# Patient Record
Sex: Male | Born: 1985 | Race: White | Hispanic: No | Marital: Married | Smoking: Former smoker
Health system: Southern US, Community
[De-identification: ages and names within clinical notes are randomized; demographics above are authoritative.]

## PROBLEM LIST (undated history)

## (undated) DIAGNOSIS — F909 Attention-deficit hyperactivity disorder, unspecified type: Secondary | ICD-10-CM

## (undated) DIAGNOSIS — J45909 Unspecified asthma, uncomplicated: Secondary | ICD-10-CM

## (undated) DIAGNOSIS — F419 Anxiety disorder, unspecified: Secondary | ICD-10-CM

## (undated) HISTORY — DX: Attention-deficit hyperactivity disorder, unspecified type: F90.9

## (undated) HISTORY — DX: Unspecified asthma, uncomplicated: J45.909

## (undated) HISTORY — DX: Anxiety disorder, unspecified: F41.9

---

## 2005-08-29 ENCOUNTER — Inpatient Hospital Stay (HOSPITAL_COMMUNITY): Admission: RE | Admit: 2005-08-29 | Discharge: 2005-08-30 | Payer: Self-pay | Admitting: Otolaryngology

## 2005-09-24 HISTORY — PX: VASECTOMY: SHX75

## 2005-09-28 ENCOUNTER — Emergency Department (HOSPITAL_COMMUNITY): Admission: EM | Admit: 2005-09-28 | Discharge: 2005-09-28 | Payer: Self-pay | Admitting: Emergency Medicine

## 2007-09-25 HISTORY — PX: MANDIBLE SURGERY: SHX707

## 2009-04-25 ENCOUNTER — Ambulatory Visit: Payer: Self-pay | Admitting: Family Medicine

## 2009-04-25 ENCOUNTER — Inpatient Hospital Stay (HOSPITAL_COMMUNITY): Admission: EM | Admit: 2009-04-25 | Discharge: 2009-04-28 | Payer: Self-pay | Admitting: Emergency Medicine

## 2010-03-18 IMAGING — CT CT ABDOMEN W/ CM
2 of 6 series · 17 of 46 positions shown, 19 images · IV contrast (APPLIED)
Comparison: CT abdomen and pelvis 09/28/2005.

CT ABDOMEN

CLINICAL DATA: Abdominal pain, scrotal pain.  Scrotal cellulitis.

CT ABDOMEN AND PELVIS WITH CONTRAST
TECHNIQUE: Multidetector CT imaging of the abdomen and pelvis was
performed using the standard protocol following bolus
administration of intravenous contrast.
Contrast: 80 ml Hmnipaque-0BB.

[Series 2: abd/pelv with 5.0 b31f st · axial · 0.78mm/px · z∈[-630,-150]mm · 14 of 108 slices shown, 16 images]
[im 6/108  soft-tissue]
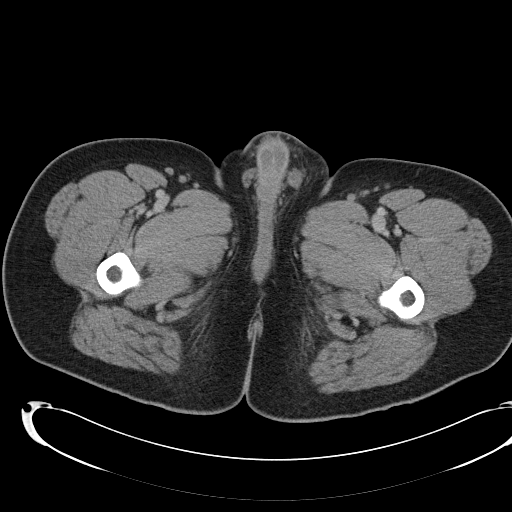
[im 6/108  bone]
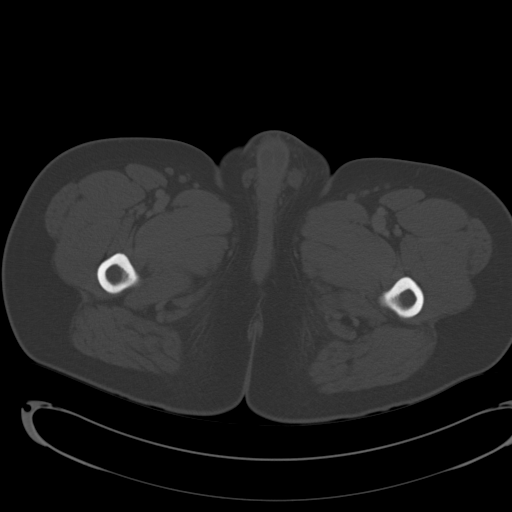
[im 12/108  soft-tissue]
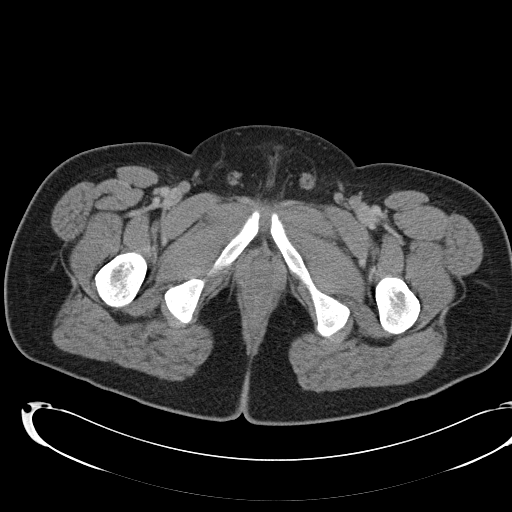
[im 24/108  soft-tissue]
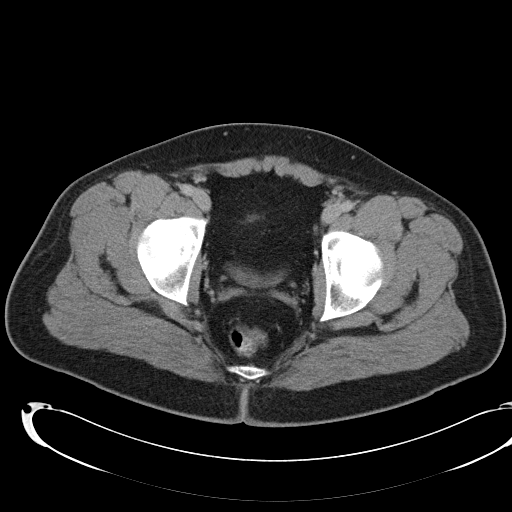
[im 30/108  soft-tissue]
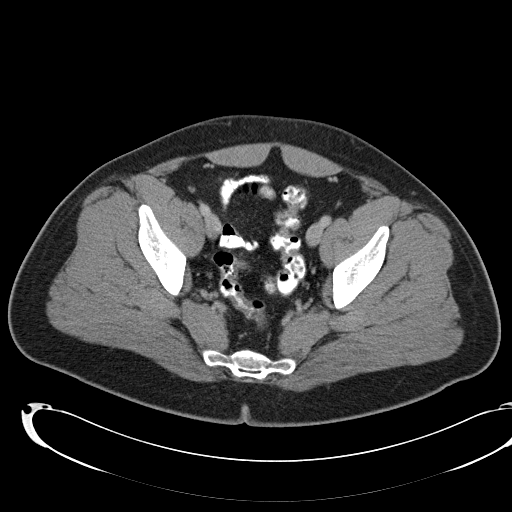
[im 36/108  soft-tissue]
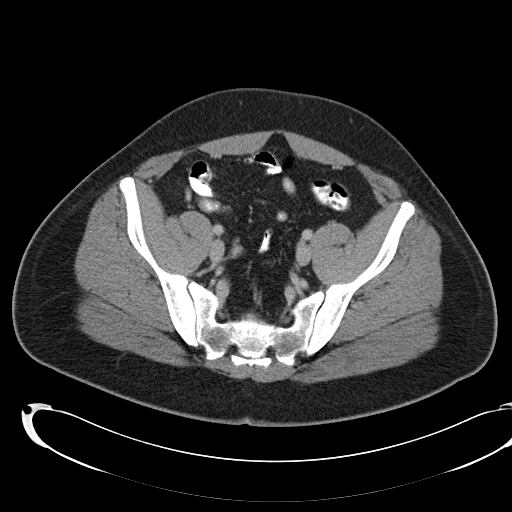
[im 42/108  soft-tissue]
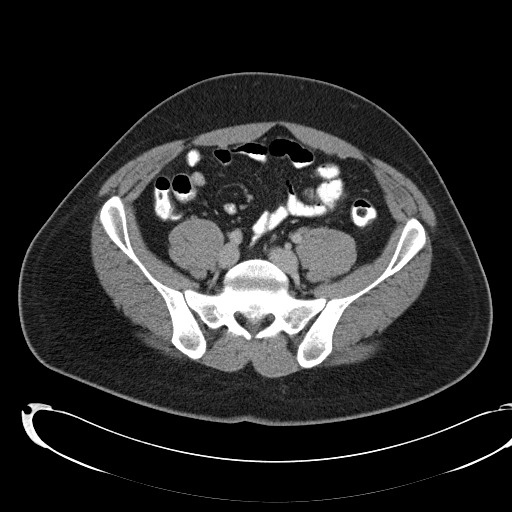
[im 48/108  soft-tissue]
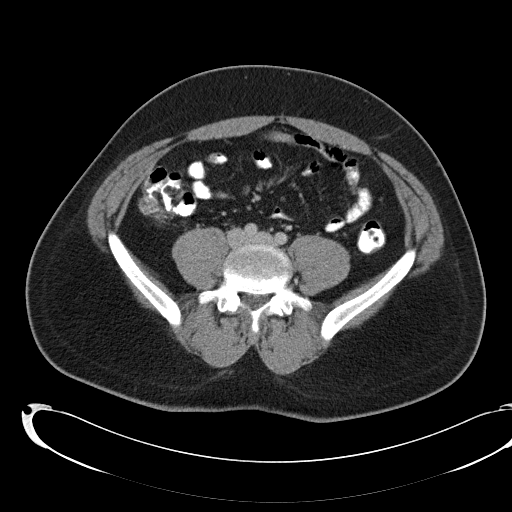
[im 60/108  soft-tissue]
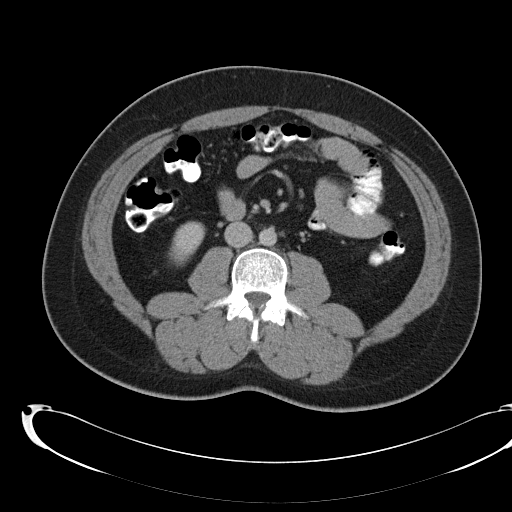
[im 66/108  soft-tissue]
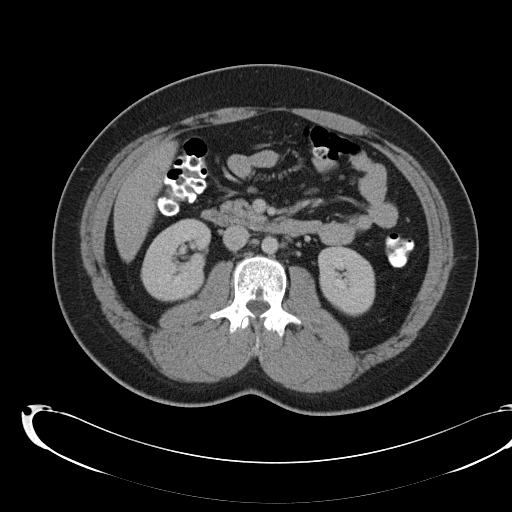
[im 66/108  bone]
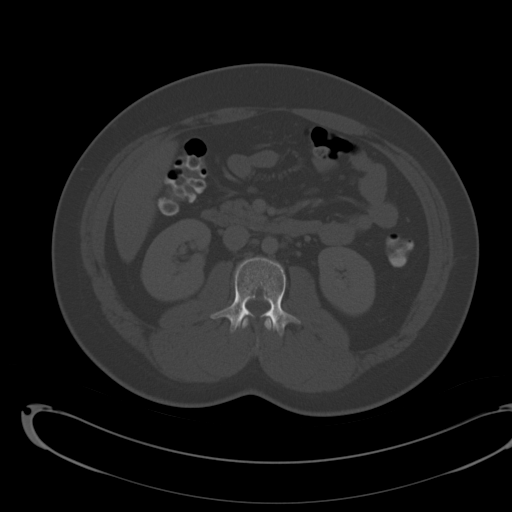
[im 72/108  soft-tissue]
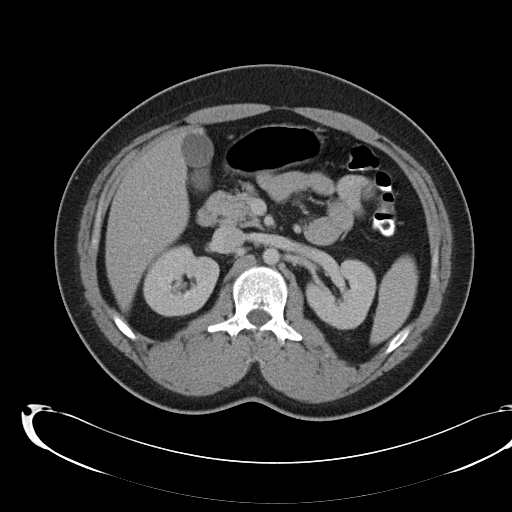
[im 78/108  soft-tissue]
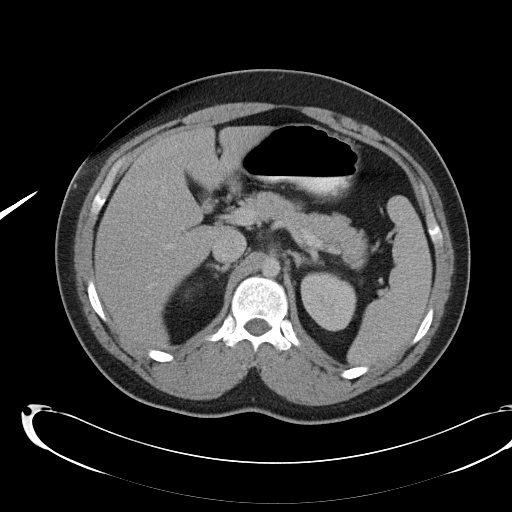
[im 84/108  soft-tissue]
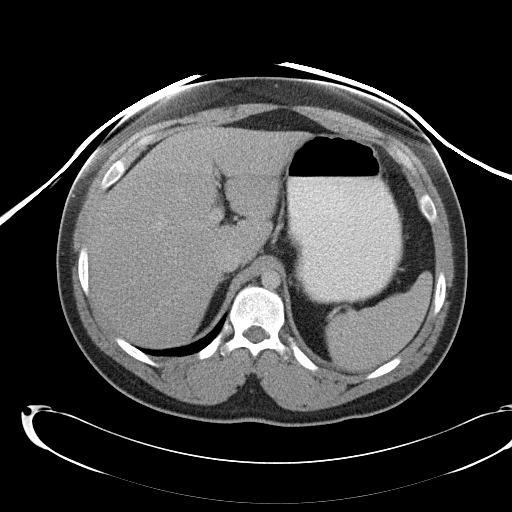
[im 96/108  soft-tissue]
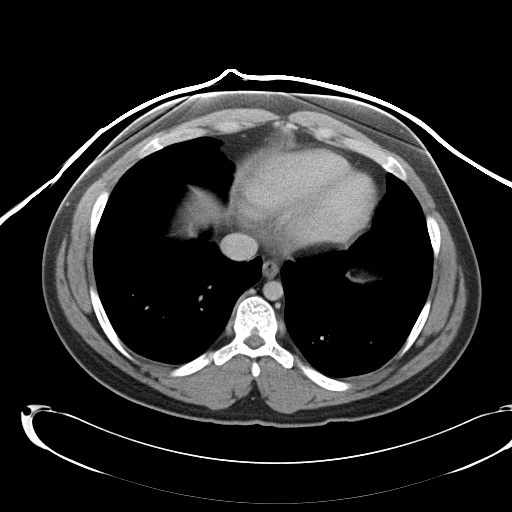
[im 102/108  soft-tissue]
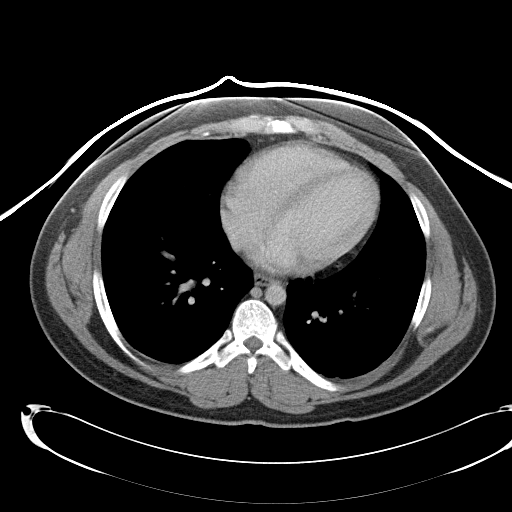

[Series 9: abd/pelv with 2.0 spo cor st · coronal · 1.05mm/px · 3 of 133 slices shown]
[im 45/133  soft-tissue]
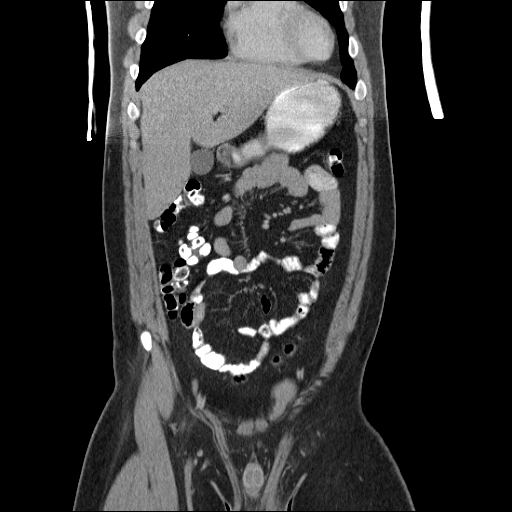
[im 59/133  soft-tissue]
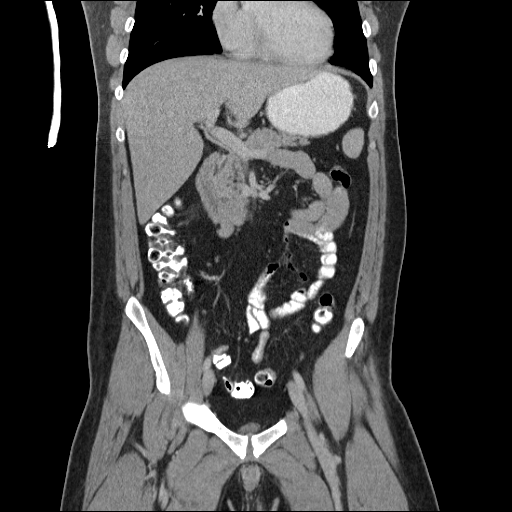
[im 74/133  soft-tissue]
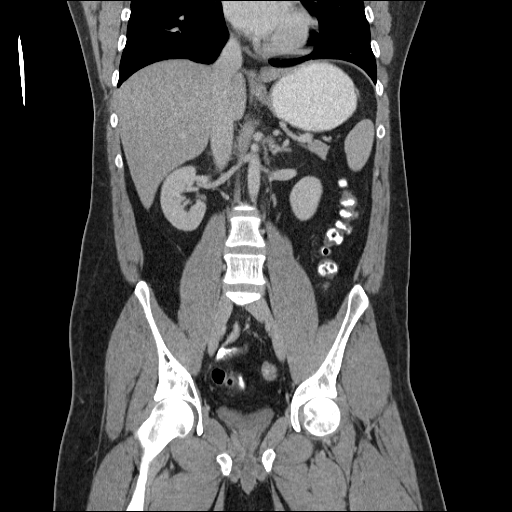

[17 of 46 positions shown; findings below may reference images not displayed]

FINDINGS: Mild dependent atelectatic changes seen in the lung
bases.  No pleural or pericardial effusion.

The gallbladder, liver, biliary tree, adrenal glands, spleen,
pancreas and kidneys all appear normal.  No abdominal
lymphadenopathy or fluid.  Stomach and small bowel appear normal.
No focal bony abnormality.
IMPRESSION: Negative exam.

CT PELVIS
FINDINGS: The patient has bilateral hydroceles.  No discrete
abscess is identified.  Note is made that  the left testicle
demonstrates low attenuation of 20.4 HU compared to the right
testicle at 36.3 HU.  Prostate gland, seminal vesicles and urinary
bladder appear normal.  No intrapelvic fluid collection.  The colon
appears normal.  Appendix is unremarkable.  No focal bony
abnormality.
IMPRESSION: Bilateral hydroceles and likely edema of the left testicle possibly
due to orchitis.  No discrete abscess is identified.  Consider
scrotal ultrasound for further evaluation.

## 2010-03-19 IMAGING — US US SCROTUM
1 series · 13 of 25 positions shown · non-contrast
Comparison: None available.

CLINICAL DATA: Scrotal pain and swelling. Scrotal cellulitis.

ULTRASOUND OF SCROTUM
DOPPLER ULTRASOUND OF SCROTAL VESSELS
TECHNIQUE: Complete ultrasound examination of the testicles,
epididymis, and other scrotal structures was performed.  Color and
duplex Doppler ultrasound was utilized to evaluate blood flow to
the testicles and scrotal contents.

[Series 1: us scrotum · 0.08mm/px · 13 of 53 slices shown]
[im 1/53]
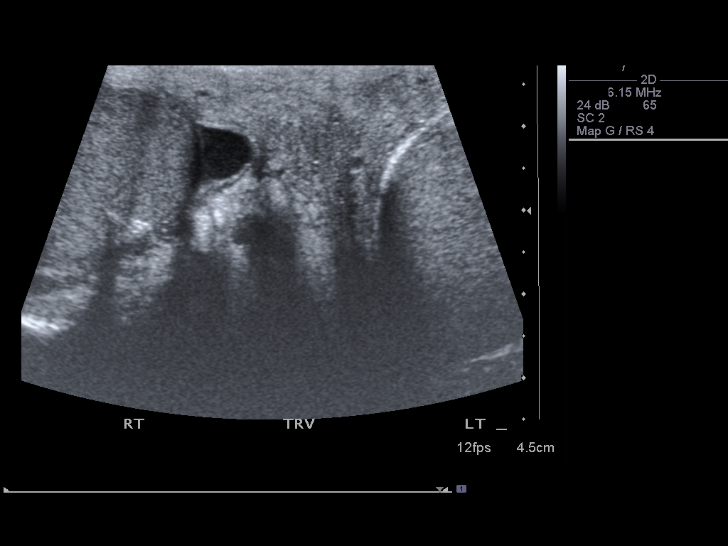
[im 5/53]
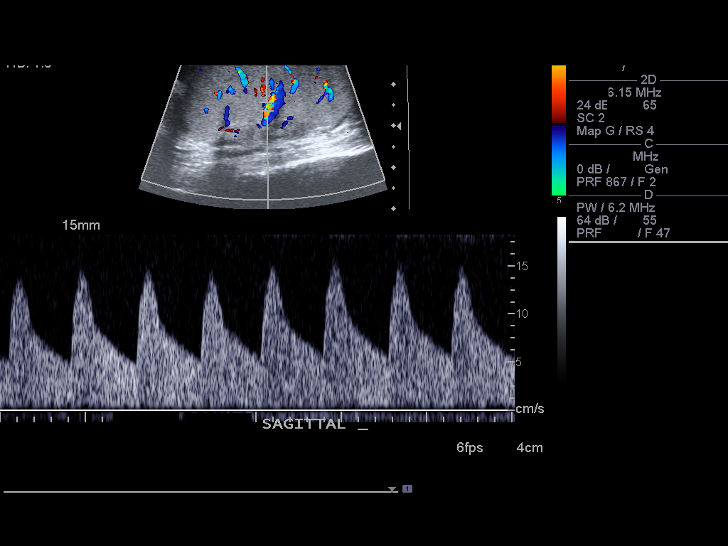
[im 9/53]
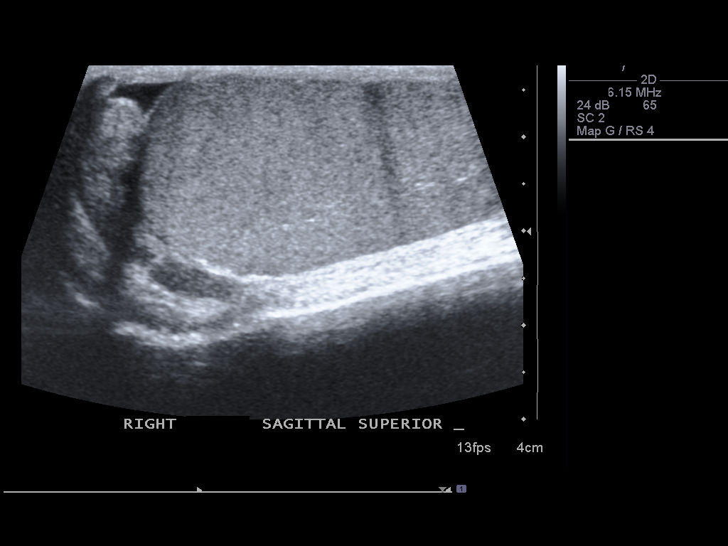
[im 14/53]
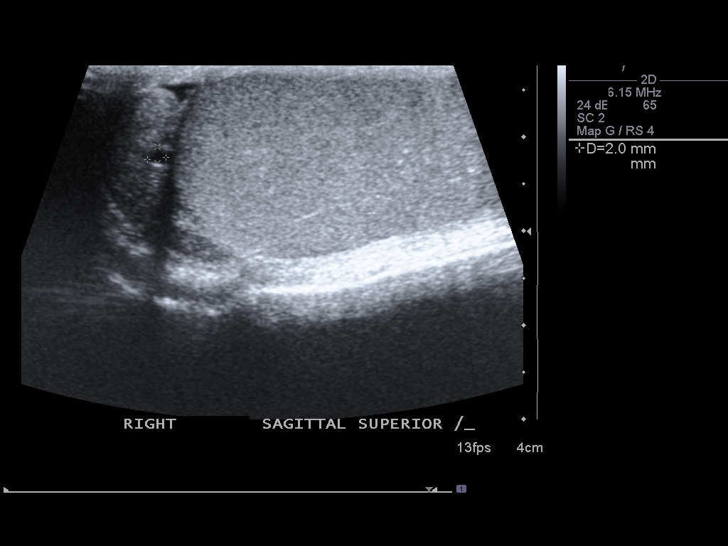
[im 18/53]
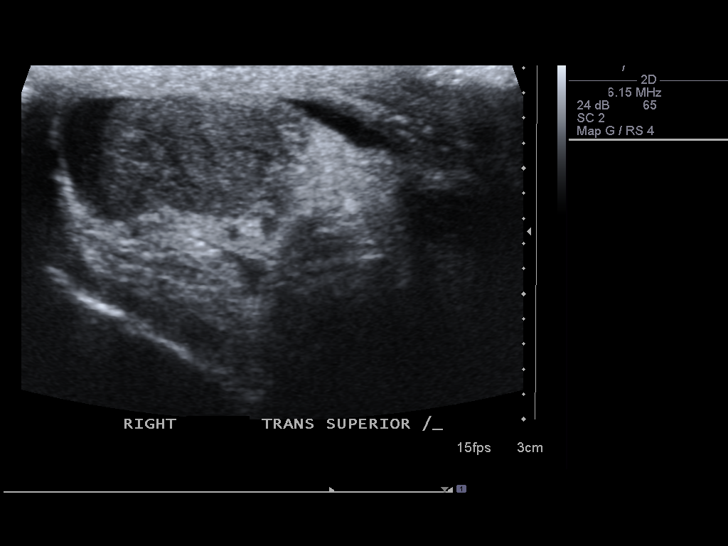
[im 22/53]
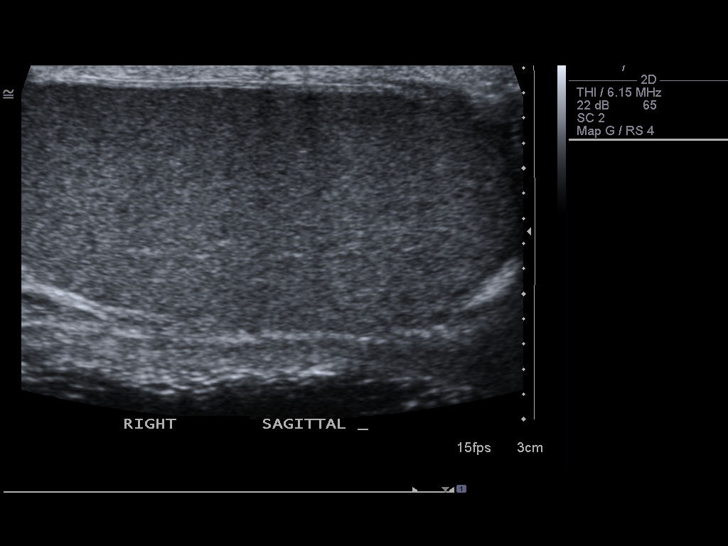
[im 27/53]
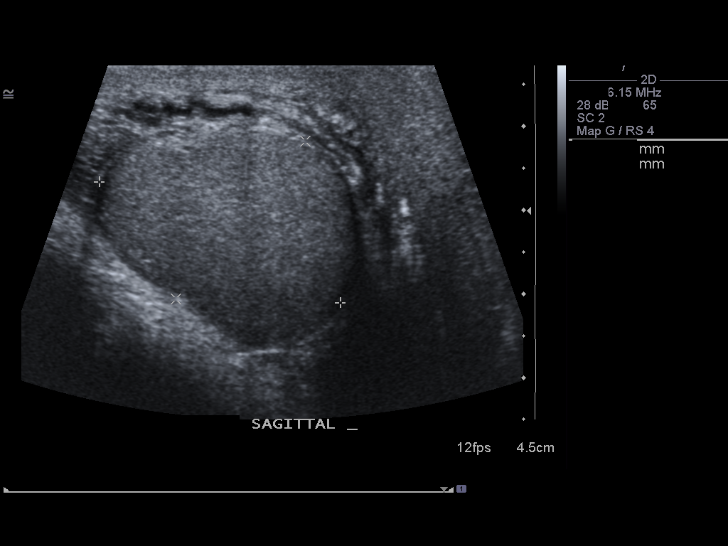
[im 31/53]
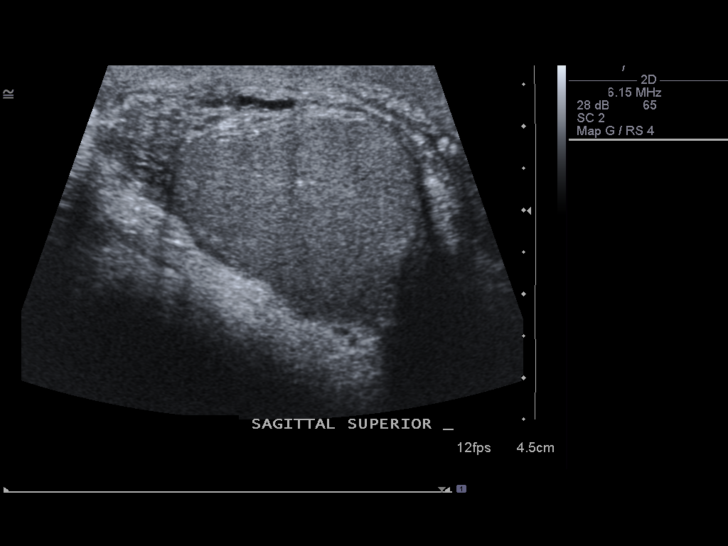
[im 35/53]
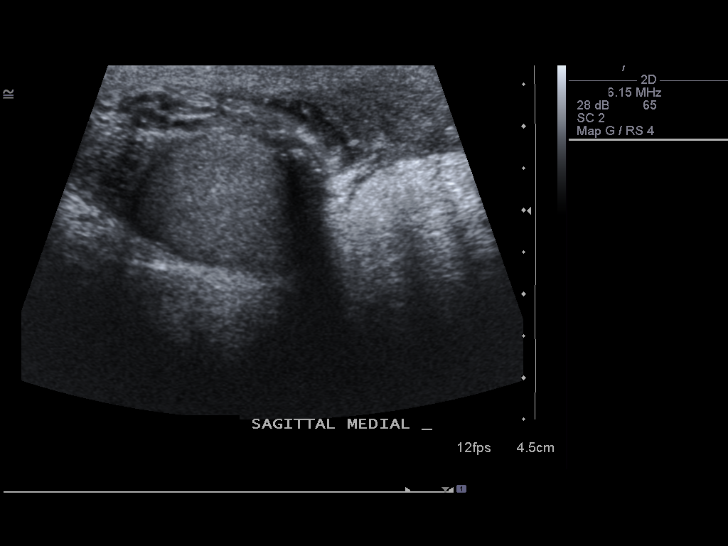
[im 40/53]
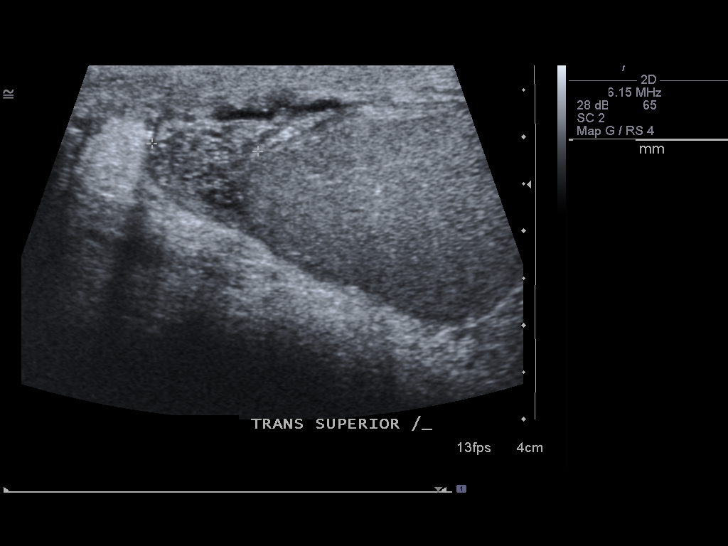
[im 44/53]
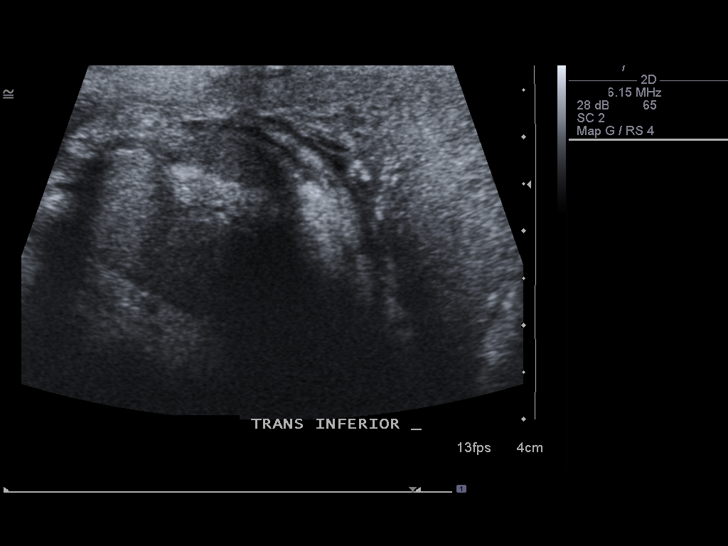
[im 48/53]
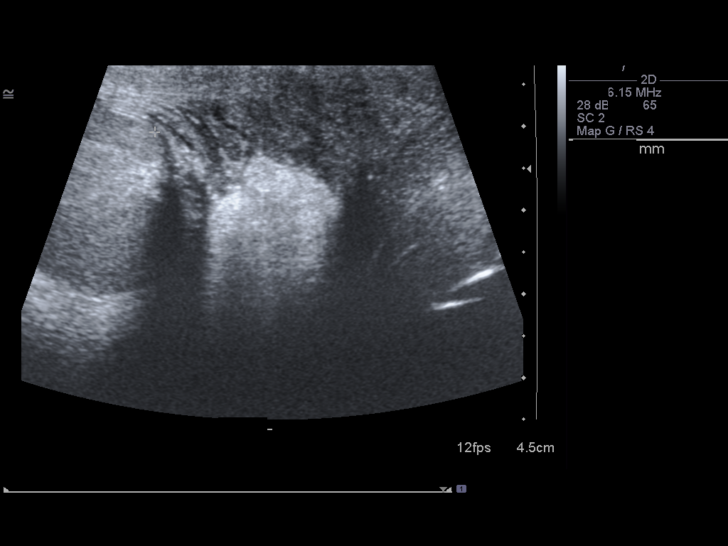
[im 53/53]
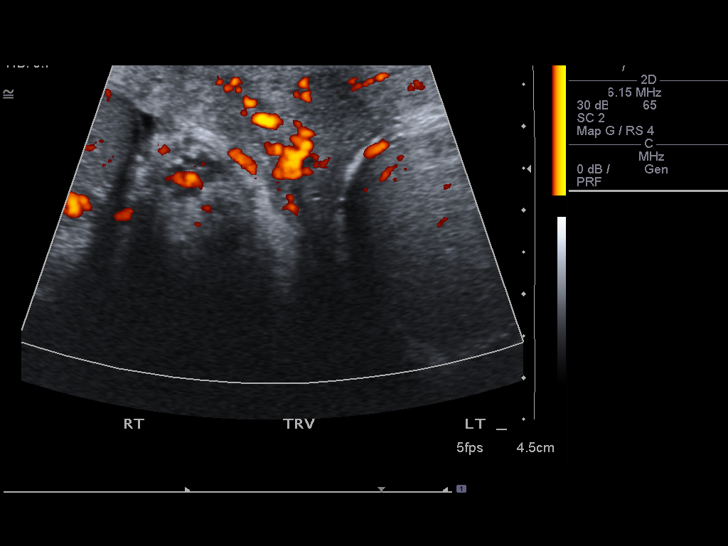

[13 of 25 positions shown; findings below may reference images not displayed]

FINDINGS: There is diffuse thickening of the scrotal skin,
measuring up to 11 mm consistent with stated clinical history of
cellulitis.  Linear areas of edema are present within the
subcutaneous tissues.  There is a moderate right hydrocele with
debris, likely reactive.

Right testicle measures 4.8 x 1.9 x 3.3 cm.  Normal arterial and
venous waveforms.  Normal color flow which is symmetric compared to
the left testicle and the epididymis.

Left testicle measures 3.2 x 2.4 x 3.0 cm.  Normal echotexture.
Normal arterial and venous waveforms.  Normal color flow compared
to the epididymis and contralateral testicle.

Left epididymis is normal.  2 mm right epididymal cyst.
IMPRESSION: 1.  Scrotal cellulitis. No focal fluid collection or abscess.  No
artifact is present within the thickened skin to suggest gas in the
tissues.
2.  Reactive right hydrocele.

## 2010-12-30 LAB — DIFFERENTIAL
Basophils Absolute: 0 10*3/uL (ref 0.0–0.1)
Basophils Relative: 0 % (ref 0–1)
Eosinophils Absolute: 0.2 10*3/uL (ref 0.0–0.7)
Eosinophils Relative: 1 % (ref 0–5)
Lymphocytes Relative: 9 % — ABNORMAL LOW (ref 12–46)
Lymphs Abs: 1.3 10*3/uL (ref 0.7–4.0)
Monocytes Absolute: 1.4 10*3/uL — ABNORMAL HIGH (ref 0.1–1.0)
Monocytes Relative: 9 % (ref 3–12)
Neutro Abs: 11.5 10*3/uL — ABNORMAL HIGH (ref 1.7–7.7)
Neutrophils Relative %: 80 % — ABNORMAL HIGH (ref 43–77)

## 2010-12-30 LAB — CBC
HCT: 36.1 % — ABNORMAL LOW (ref 39.0–52.0)
HCT: 36.3 % — ABNORMAL LOW (ref 39.0–52.0)
HCT: 42.4 % (ref 39.0–52.0)
Hemoglobin: 12.5 g/dL — ABNORMAL LOW (ref 13.0–17.0)
Hemoglobin: 12.6 g/dL — ABNORMAL LOW (ref 13.0–17.0)
Hemoglobin: 14.7 g/dL (ref 13.0–17.0)
MCHC: 34.4 g/dL (ref 30.0–36.0)
MCHC: 34.6 g/dL (ref 30.0–36.0)
MCHC: 34.9 g/dL (ref 30.0–36.0)
MCHC: 35 g/dL (ref 30.0–36.0)
MCV: 88.8 fL (ref 78.0–100.0)
MCV: 89 fL (ref 78.0–100.0)
MCV: 89.7 fL (ref 78.0–100.0)
Platelets: 191 10*3/uL (ref 150–400)
Platelets: 226 10*3/uL (ref 150–400)
Platelets: 236 10*3/uL (ref 150–400)
Platelets: 242 10*3/uL (ref 150–400)
RBC: 4.05 MIL/uL — ABNORMAL LOW (ref 4.22–5.81)
RBC: 4.05 MIL/uL — ABNORMAL LOW (ref 4.22–5.81)
RBC: 4.78 MIL/uL (ref 4.22–5.81)
RDW: 13.6 % (ref 11.5–15.5)
RDW: 13.6 % (ref 11.5–15.5)
RDW: 13.7 % (ref 11.5–15.5)
RDW: 14.1 % (ref 11.5–15.5)
WBC: 14.4 10*3/uL — ABNORMAL HIGH (ref 4.0–10.5)
WBC: 5.9 10*3/uL (ref 4.0–10.5)
WBC: 8.6 10*3/uL (ref 4.0–10.5)

## 2010-12-30 LAB — BASIC METABOLIC PANEL
BUN: 7 mg/dL (ref 6–23)
Calcium: 8.4 mg/dL (ref 8.4–10.5)
Chloride: 105 mEq/L (ref 96–112)
Creatinine, Ser: 0.88 mg/dL (ref 0.4–1.5)
GFR calc Af Amer: >60 mL/min (ref >60)
GFR calc non Af Amer: >60 mL/min (ref >60)

## 2010-12-30 LAB — POCT I-STAT, CHEM 8
BUN: 7 mg/dL (ref 6–23)
Calcium, Ion: 1.17 mmol/L (ref 1.12–1.32)
Chloride: 106 mEq/L (ref 96–112)
Creatinine, Ser: 1.1 mg/dL (ref 0.4–1.5)
Glucose, Bld: 108 mg/dL — ABNORMAL HIGH (ref 70–99)
HCT: 42 % (ref 39.0–52.0)
Hemoglobin: 14.3 g/dL (ref 13.0–17.0)
Potassium: 3.6 mEq/L (ref 3.5–5.1)
Sodium: 137 mEq/L (ref 135–145)
TCO2: 22 mmol/L (ref 0–100)

## 2010-12-30 LAB — URINALYSIS, ROUTINE W REFLEX MICROSCOPIC
Glucose, UA: NEGATIVE mg/dL
Hgb urine dipstick: NEGATIVE
Specific Gravity, Urine: 1.031 — ABNORMAL HIGH (ref 1.005–1.030)

## 2010-12-30 LAB — CULTURE, BLOOD (ROUTINE X 2): Culture: NO GROWTH

## 2010-12-30 LAB — COMPREHENSIVE METABOLIC PANEL
ALT: 24 U/L (ref 0–53)
AST: 25 U/L (ref 0–37)
Albumin: 3.4 g/dL — ABNORMAL LOW (ref 3.5–5.2)
Alkaline Phosphatase: 60 U/L (ref 39–117)
CO2: 25 mEq/L (ref 19–32)
Chloride: 108 mEq/L (ref 96–112)
Creatinine, Ser: 0.94 mg/dL (ref 0.4–1.5)
GFR calc Af Amer: >60 mL/min (ref >60)
GFR calc non Af Amer: >60 mL/min (ref >60)
Potassium: 3.4 mEq/L — ABNORMAL LOW (ref 3.5–5.1)
Total Bilirubin: 1.1 mg/dL (ref 0.3–1.2)

## 2010-12-30 LAB — URINE CULTURE
Colony Count: NO GROWTH
Culture: NO GROWTH
Special Requests: NEGATIVE

## 2010-12-30 LAB — WOUND CULTURE

## 2010-12-30 LAB — GRAM STAIN

## 2011-02-06 NOTE — H&P (Signed)
NAMETOBY, BREITHAUPT              ACCOUNT NO.:  [id number]   MEDICAL RECORD NO.:  [id number]          PATIENT TYPE:  OBV   LOCATION:  1824                         FACILITY:  [facility]   PHYSICIAN:  Wayne A. Sheffield Slider, M.D.    DATE OF BIRTH:  August 03, 1986   DATE OF ADMISSION:  04/25/2009  DATE OF DISCHARGE:                              HISTORY & PHYSICAL   REASON FOR HOSPITALIZATION:  Scrotal pain with rash.   PRIMARY CARE Chamya Hunton:  [facility].   HISTORY OF PRESENT ILLNESS:  The patient is a 25 year old male with a  history of MRSA infection and asthma who presents with a skin rash since  last Thursday.  The patient describes that the rash initially started as  a mosquito bite last Thursday and was located on his left calf.  The  area progressed, became more tender, red and swollen.  It continued that  way until his presentation to [facility] emergency department yesterday.  At that time, the patient also had 2 red areas on his scrotum which were  similar and beginning to become painful; however, the patient reports  that he did not mention these to the emergency department doctor at  [facility].  At that time, the patient was sent home with a prescription  for Bactrim and Keflex which he has started.  The patient reports that  today his left calf had improved significantly; however, his scrotal  rash had deteriorated significantly.  The patient reports that the  scrotum has become significantly worse with increased redness, swelling  and exquisite tenderness.  We have had trouble controlling the patient's  pain in the emergency department, even on IV Dilaudid.  We do not have  any imaging available for the patient at this time due to his pain.  Wife and daughter have also had multiple abscesses in the past, and the  patient does have a history of MRSA infection back in 2006 which  required surgical drainage by ENT.   ALLERGIES:  NO KNOWN DRUG ALLERGIES.   MEDICATIONS:  1. Keflex and Bactrim both started yesterday.  2. Albuterol  3. Vitamin B12.   PAST MEDICAL HISTORY:  1. Asthma.  2. History of MRSA status post drainage by ENT in 2006.  3. The patient does not report any other significant history of boils.      He does report frequent episodes of pimples that resolve on their      own.   PAST SURGERIES:  1. History of MRSA.  I&D of the jaw and neck in 2006.  2. Status post hernia repair at age [age].  3. Status post drainage of an abscess pursuant to a root canal      approximately 2 months ago.   FAMILY HISTORY:  1. Significant for early myocardial infarction in his grandfather and      uncles, all in their 82s.  2. Stroke in a grandmother.  3. Wife and daughter with frequent skin abscesses and boils.   SOCIAL HISTORY:  The patient denies any tobacco, drugs or illegal drug  use or any history thereof.  The patient works as a [employer].  The patient's wife is named Jeralyn Ruths.  Her phone  number is [phone].  The patient also has a 16-year-old daughter.   REVIEW OF SYSTEMS:  Positive for some mild diarrhea, some dysuria, also  fevers and chills along with decreased appetite.  The patient denies any  nausea or vomiting.  His review of systems is otherwise negative except  as already noted in the HPI.   PHYSICAL EXAMINATION:  VITALS:  Temperature is 101.5, blood pressure  142/85, heart rate 108-128, O2 saturations are 99% on room air.  GENERAL:  The patient is alert and cooperative.  He does appear to be in  pain.  HEENT: Pupils are equal, round and reactive to light.  Extraocular  muscles are intact.  Oropharynx is pink and moist.  CARDIOVASCULAR:  Regular rate and rhythm.  No murmurs.  LUNGS:  Clear to auscultation bilaterally.  Work of breathing is  unlabored.  ABDOMEN:  Positive bowel sounds, soft, nondistended.  No rebound or  guarding.  The patient does have mild suprapubic tenderness to   palpation.  GENITOURINARY:  The patient has a normal penis with no drainage, redness  or discharge from the meatus.  His scrotum is diffusely swollen and  erythematous and edematous.  There are no necrotic areas on the scrotum.  He has two pimple-like lesions on his anterior scrotum which are not  actively draining.  Further palpation of the exam is limited to his  pain, and I could not get a good sense of testicular size or any  swelling that might exist therein.  EXTREMITIES:  2+ dorsalis pedis and radial pulses.  SKIN:  Left posterior calf shows a resolving area of cellulitis  approximately 5 x 5 cm in diameter, and there is no drainage or  discharge from this area.  There is erythema, but no indurated lesion.   SIGNIFICANT WORK-UP TO THIS POINT:  White blood cell count is elevated  at 14.4 with a left shift at 80% neutrophils.  Hemoglobin 14.7,  platelets 242.  i-STAT chemistry shows sodium 137, potassium 3.6, CO2 of  22, chloride 106, BUN 7, creatinine 1.1, glucose 108.  Urinalysis within  normal limits except for a specific gravity that is concentrated at  1.031.   ASSESSMENT/PLAN:  A 25 year old with scrotal cellulitis.  1. Scrotal cellulitis.  The patient does have an increased white blood      cell count, left shift and fever.  We will check blood cultures x2,      knowing that the patient has already been started on antibiotics.      The patient does not have any risk factors Fournier's gangrene in      the scrotum, but exam does not support this.  Given the patient's      personal and family history of MRSA, we will treat the patient with      vancomycin and clindamycin IV, the latter to treat possible group A      strep toxin release.  We will follow the patient's clinical course.      I doubt that the patient will tolerate an ultrasound at this point,      so we will proceed with a contrast CT scan for further evaluation      for abscess and possible gas in the soft  tissue.  We have consulted      urology, and Dr. Aldean Ast has kindly agreed to see the  patient for      a more formal urological evaluation.  2. Asthma.  Continue albuterol.  3. Prophylaxis.  Will place the patient on heparin 5000 units t.i.d.      for DVT prophylaxis.  The patient does not have an indication for      PPI prophylaxis at this point.   DISPOSITION:  We will follow up urine culture, CT scan and urology  recommendations.  We will follow the patient's clinical course, and we  are hopeful for improvement on IV antibiotics.      Myrtie Soman, MD  Electronically Signed      Arnette Norris. Sheffield Slider, M.D.  Electronically Signed    TE/MEDQ  D:  04/25/2009  T:  04/25/2009  Job:  [id number]

## 2011-02-06 NOTE — Consult Note (Signed)
Vincent [name], Vincent [name]              ACCOUNT NO.:  [id number]   MEDICAL RECORD NO.:  [id number]          PATIENT TYPE:  OBV   LOCATION:  5021                         FACILITY:  [facility]   PHYSICIAN:  Courtney Paris, M.D.DATE OF BIRTH:  1986-05-12   DATE OF CONSULTATION:  04/25/2009  DATE OF DISCHARGE:                                 CONSULTATION   REQUESTING PHYSICIAN:  Mahalia Longest.   REASON FOR CONSULTATION:  Scrotal swelling and pain.   HISTORY:  This 25 year old patient was admitted for scrotal swelling and  pain of 2 days' duration thru the ER.  He had MRSA infection of his left  leg that has resolved.  He noticed an insect bite with swelling and  redness on April 21, 2009.  He was put on Keflex and Bactrim, did not  complain of scrotal swelling at that time, which has gotten worse in the  last 2 days.  He denies fever, diarrhea, dysuria.  No urinary symptoms.  He has never had scrotal swelling or problems before.   PAST MEDICAL HISTORY:  He does have asthma.  He has had MRSA cellulitis  in the past, treated surgically in 2006.  He had vasectomy in 2007 and a  hernia repair at age 42.  He is not diabetic, does not take steroids for  his asthma.   FAMILY HISTORY AND SOCIAL HISTORY:  He is married, nonsmoker.  No  alcohol or drug use.  He lives with his wife and his 15-year-old  daughter.  He works as a Estate agent.   PHYSICAL EXAMINATION:  VITAL SIGNS:  Temperature is 101.5, blood  pressure 149/85, heart rate 99, respirations 20.  GENERAL:  He is an young, dark-haired white male, lying quietly in bed,  no acute distress.  HEENT:  Clear.  NECK:  Supple.  ABDOMEN:  Soft benign without masses or tenderness.  GENITOURINARY:  Penis is normal, circumcised.  He does have somewhat  enlarged scrotum, which is diffusely tender but no cellulitis.  He has 2  small scabs on the left anterior scrotum that he says was bite.  There  is no evidence of any localized abscess.  The  perineum is normal.  RECTAL:  Negative.  EXTREMITIES:  All looked normal.   His white count is 14,400.  Urinalysis is clear.  Hematocrit 42.  Creatinine is 1.1, BUN is 7.   IMPRESSION:  1. Diffusely tender, swollen scrotum.  2. History of methicillin-resistant Staphylococcus aureus infections.   RECOMMENDATIONS:  He is scheduled for a CT scan of the pelvis and  abdomen, and I would suspect that would be the easiest on the patient.  An ultrasound would be helpful if a CT is not diagnostic.  It could be  epididymitis, and  ultrasound will show whether he has increased blood flow to the  epididymis or not.  It would be certainly more uncomfortable for him to  have this test.  We will follow with you.  He certainly does not seem to  have Fournier's gangrene.  There are no risk factors, and there is  nothing to indicate that at  this time.      Courtney Paris, M.D.  Electronically Signed     HMK/MEDQ  D:  04/25/2009  T:  04/26/2009  Job:  [id number]

## 2011-02-06 NOTE — Discharge Summary (Signed)
[name], [name]              ACCOUNT NO.:  [id number]   MEDICAL RECORD NO.:  [id number]          PATIENT TYPE:  OBV   LOCATION:  5021                         FACILITY:  [facility]   PHYSICIAN:  Wayne A. Sheffield Slider, M.D.    DATE OF BIRTH:  May 31, 1986   DATE OF ADMISSION:  04/25/2009  DATE OF DISCHARGE:  04/28/2009                               DISCHARGE SUMMARY   PRIMARY CARE Giulian Goldring:  [facility], Dr.  Lorin Picket.   DISCHARGE DIAGNOSIS:  Scrotal cellulitis.   DISCHARGE MEDICATIONS:  1. Bactrim 2 tablets twice a day for 6 days.  2. Percocet take 1-2 pills every 4-6 hours as needed for pain.   CONSULTS:  Courtney Paris, MD from Urology was consulted on this  case.   PROCEDURES:  1. CT of the abdomen and pelvis.  The abdomen was without abnormality.      No evidence of cellulitis or discrete abscesses within the scrotum.      The patient has bilateral reactive hydroceles, which may represent      orchitis.  2. Scrotal ultrasound.  No evidence of discrete abscess.  There is a      single right-sided reactive hydrocele.  There is diffuse thickening      of the skin of the scrotum consistent with scrotal cellulitis.   LABORATORY DATA:  On the day of discharge, CBC was within normal limits.  CMP was also within normal limits.   HOSPITAL COURSE:  The patient is a 25 year old male who presented to  [facility] Emergency Department with complaints of scrotal swelling and  tenderness as well as swelling and tenderness on the posterior calf of  the left leg.  The patient originally presented to an outside hospital  with complaints of left leg swelling and tenderness.  The patient was  diagnosed with left calf cellulitis at that time and given a  prescription for Keflex and Bactrim.  The patient did not complain of  any scrotal symptoms at that time.  On April 25, 2009, the patient  presented to the [facility] Emergency Department with complaints of  scrotal  swelling, severe tenderness, and pain.  The patient had only  been on Keflex and Bactrim for 1 day and these medications were  discontinued on admission.  He was started on IV vancomycin and  clindamycin to cover for possible MRSA cellulitis infection.  Given the  patient's exquisite tenderness on examination, it was felt that he would  not be able to tolerate a scrotal ultrasound to inspect for abscess, so  he was sent for CT of the abdomen and pelvis, which showed no discrete  abscess.  The patient's clinical condition improved on antibiotics and  the swelling and pain decreased.  Urology was consulted and recommended  a scrotal ultrasound at this time to assess for possible epididymitis  versus an abscess versus cellulitis.  Scrotal ultrasound was done, which  was consistent with scrotal cellulitis.  The patient continued to  improve on IV vancomycin and clindamycin, and was transitioned to oral  antibiotics the day before discharge.  He was  placed on double strength  Bactrim 2 tablets twice daily to complete a 10-day course.  He was also  given a prescription for Percocet 5/325 to use as needed for scrotal  pain.   DISCHARGE INSTRUCTIONS:  The patient may eat a regular diet.  The  patient may resume normal activity without any restrictions and return  to work on Monday, May 02, 2009.  The patient may wash the wound on  the left calf with warm soap and water.  The patient was instructed to  complete the 10-day course of antibiotics.   FOLLOWUP APPOINTMENTS:  The patient has a followup appointment with Dr.  Lorin Picket of [facility], phone number is [phone].  The appointment is on Thursday, August 12 at 2:15 p.m.   DISCHARGE CONDITION/LOCATION:  The patient's condition on discharge was  stable and improved.  The patient will be discharged to home   Two days after developing left posterior calf cellulitis, he originally  presented to outside hospital ED where he  was diagnosed with left lower  leg cellulitis and given a prescription for Keflex and Bactrim.  The  patient did not complain of scrotal swelling and tenderness at that  time.  He presented the next day to [facility] ED on April 25, 2009,  with complaints of scrotal swelling and tenderness.   On admission, he came in with a fever of 101.5 with scrotal swelling,  pain, and severe tenderness.  He had been on Keflex and Bactrim for  treatment of the left calf cellulitis, which was discontinued.  The  patient was then started on IV vancomycin and clindamycin to cover for  possible MRSA infection.  Clinical condition improved on antibiotic  therapy.  Swelling and pain decreased.  Given concern for scrotal  abscess, Urology was consulted.      Bobby Rumpf, MD  Electronically Signed      Arnette Norris. Sheffield Slider, M.D.  Electronically Signed    KC/MEDQ  D:  04/28/2009  T:  04/29/2009  Job:  [id number]   cc:   Dr. Lorin Picket

## 2011-02-09 NOTE — Op Note (Signed)
NAMEKEE, DRUDGE              ACCOUNT NO.:  [id number]   MEDICAL RECORD NO.:  [id number]          PATIENT TYPE:  AMB   LOCATION:  SDS                          FACILITY:  [facility]   PHYSICIAN:  Jefry H. Pollyann Kennedy, MD     DATE OF BIRTH:  Nov 10, 1985   DATE OF PROCEDURE:  08/29/2005  DATE OF DISCHARGE:                                 OPERATIVE REPORT   PREOP DIAGNOSIS:  Facial abscess, suspected methicillin-resistant  Staphylococcus aureus.   POSTOPERATIVE DIAGNOSIS:  Facial abscess, suspected methicillin-resistant  Staphylococcus aureus.   PROCEDURE:  Incision and drainage of facial abscess.   SURGEON:  Jefry H. Pollyann Kennedy, MD   ANESTHESIA:  General endotracheal anesthesia was used.   COMPLICATIONS:  None.   BLOOD LOSS:  None.   FINDINGS:  A superficial abscess cavity left face overlying the jaw with  thick purulent material contained within. The specimen was sent for culture  and sensitivity testing.   REFERRING PHYSICIAN:  Dr. Lorin Picket at [facility].   HISTORY:  A 25 year old with a 3-day history of increasing swelling of the  left side of the face. He has a recent history of MRSA exposure.  This  started off as a small pimple, on his skin, and progressively got worse. He  has been on oral Bactrim for 2 days. The risks, benefits, alternative, and  complications to procedure were explained to the patient who seemed to  understand and agreed to the surgery.   DESCRIPTION OF PROCEDURE:  The patient was taken to the operating room and  placed on the operating table in the supine position. Following induction of  general endotracheal anesthesia the face was prepped and draped in a  standard fashion. The site that was already draining was opened using a  hemostat and a large amount of thick purulent material was obtained and  samples were sent for culture and sensitivity testing. The wound was opened  widely using blunt dissection. The depths of the wound  were probed with the  hemostat. There did not appear to be  any communication with the mandible. The wound was irrigated with saline and  all remaining purulent material was expressed out. The wound was then packed  with 1/4-inch Iodoform packing; and then sterile dressing was applied. The  patient was then awakened, extubated, and transferred to recovery.      Jefry H. Pollyann Kennedy, MD  Electronically Signed     JHR/MEDQ  D:  08/29/2005  T:  08/30/2005  Job:  [id number]   cc:   [facility] Dr. Lorin Picket

## 2012-12-20 ENCOUNTER — Other Ambulatory Visit: Payer: Self-pay | Admitting: *Deleted

## 2012-12-20 NOTE — Telephone Encounter (Signed)
Last filled on 11-25-12 for 60 tabs. Last seen 09/11/12

## 2012-12-22 ENCOUNTER — Other Ambulatory Visit: Payer: Self-pay | Admitting: Physician Assistant

## 2012-12-22 MED ORDER — ALPRAZOLAM 0.5 MG PO TBDP: 0.5000 mg | ORAL_TABLET | Freq: Three times a day (TID) | ORAL | Status: DC | PRN

## 2013-01-23 ENCOUNTER — Other Ambulatory Visit: Payer: Self-pay | Admitting: Nurse Practitioner

## 2013-01-27 NOTE — Telephone Encounter (Signed)
Ok to call in xanax RX

## 2013-01-27 NOTE — Telephone Encounter (Signed)
LAST RF 12/23/12. CALL IN [facility]

## 2013-01-27 NOTE — Telephone Encounter (Signed)
Called to [facility] 

## 2013-02-24 ENCOUNTER — Other Ambulatory Visit: Payer: Self-pay | Admitting: Nurse Practitioner

## 2013-02-26 NOTE — Telephone Encounter (Signed)
Call in rx for xanax with 1 refill

## 2013-02-26 NOTE — Telephone Encounter (Signed)
LAST RF 01/27/13. CALL IN [facility]. LAST OV 12/13.

## 2013-02-27 ENCOUNTER — Other Ambulatory Visit: Payer: Self-pay | Admitting: Nurse Practitioner

## 2013-02-27 NOTE — Telephone Encounter (Signed)
rx called in

## 2013-03-16 ENCOUNTER — Telehealth: Payer: Self-pay | Admitting: Family Medicine

## 2013-03-16 NOTE — Telephone Encounter (Signed)
appt given  

## 2013-03-19 ENCOUNTER — Encounter: Payer: Self-pay | Admitting: Nurse Practitioner

## 2013-03-19 ENCOUNTER — Ambulatory Visit (INDEPENDENT_AMBULATORY_CARE_PROVIDER_SITE_OTHER): Payer: PRIVATE HEALTH INSURANCE | Admitting: Nurse Practitioner

## 2013-03-19 VITALS — BP 125/74 | HR 96 | Temp 98.4°F | Ht 67.5 in | Wt 194.5 lb

## 2013-03-19 DIAGNOSIS — F988 Other specified behavioral and emotional disorders with onset usually occurring in childhood and adolescence: Secondary | ICD-10-CM

## 2013-03-19 DIAGNOSIS — F900 Attention-deficit hyperactivity disorder, predominantly inattentive type: Secondary | ICD-10-CM

## 2013-03-19 MED ORDER — LISDEXAMFETAMINE DIMESYLATE 50 MG PO CAPS: 50.0000 mg | ORAL_CAPSULE | ORAL | Status: DC

## 2013-03-19 NOTE — Progress Notes (Signed)
  Subjective:    Patient ID: [name], male    DOB: December 18, 1985, 27 y.o.   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 See ASRS scale fro more information.    Review of Systems  All other systems reviewed and are negative.       Objective:   Physical Exam  Constitutional: He is oriented to person, place, and time. He appears well-developed and well-nourished.  Cardiovascular: Normal rate, normal heart sounds and intact distal pulses.   Pulmonary/Chest: Effort normal and breath sounds normal.  Neurological: He is alert and oriented to person, place, and time.  Psychiatric: He has a normal mood and affect. His behavior is normal. Judgment and thought content normal.   BP 125/74  Pulse 96  Temp(Src) 98.4 F (36.9 C) (Oral)  Ht 5' 7.5" (1.715 m)  Wt 194 lb 8 oz (88.225 kg)  BMI 30 kg/m2        Assessment & Plan:  1. ADHD (attention deficit hyperactivity disorder), inattentive type Stress management Medication side effects discussed Follow-up in 3 weeks  Mary-Margaret Daphine Deutscher, FNP

## 2013-03-19 NOTE — Patient Instructions (Signed)
Attention Deficit Hyperactivity Disorder Attention deficit hyperactivity disorder (ADHD) is a problem with behavior issues based on the way the brain functions (neurobehavioral disorder). It is a common reason for behavior and academic problems in school. CAUSES  The cause of ADHD is unknown in most cases. It may run in families. It sometimes can be associated with learning disabilities and other behavioral problems. SYMPTOMS  There are 3 types of ADHD. The 3 types and some of the symptoms include:  Inattentive  Gets bored or distracted easily.  Loses or forgets things. Forgets to hand in homework.  Has trouble organizing or completing tasks.  Difficulty staying on task.  An inability to organize daily tasks and school work.  Leaving projects, chores, or homework unfinished.  Trouble paying attention or responding to details. Careless mistakes.  Difficulty following directions. Often seems like is not listening.  Dislikes activities that require sustained attention (like chores or homework).  Hyperactive-impulsive  Feels like it is impossible to sit still or stay in a seat. Fidgeting with hands and feet.  Trouble waiting turn.  Talking too much or out of turn. Interruptive.  Speaks or acts impulsively.  Aggressive, disruptive behavior.  Constantly busy or on the go, noisy.  Combined  Has symptoms of both of the above. Often children with ADHD feel discouraged about themselves and with school. They often perform well below their abilities in school. These symptoms can cause problems in home, school, and in relationships with peers. As children get older, the excess motor activities can calm down, but the problems with paying attention and staying organized persist. Most children do not outgrow ADHD but with good treatment can learn to cope with the symptoms. DIAGNOSIS  When ADHD is suspected, the diagnosis should be made by professionals trained in ADHD.  Diagnosis will  include:  Ruling out other reasons for the child's behavior.  The caregivers will check with the child's school and check their medical records.  They will talk to teachers and parents.  Behavior rating scales for the child will be filled out by those dealing with the child on a daily basis. A diagnosis is made only after all information has been considered. TREATMENT  Treatment usually includes behavioral treatment often along with medicines. It may include stimulant medicines. The stimulant medicines decrease impulsivity and hyperactivity and increase attention. Other medicines used include antidepressants and certain blood pressure medicines. Most experts agree that treatment for ADHD should address all aspects of the child's functioning. Treatment should not be limited to the use of medicines alone. Treatment should include structured classroom management. The parents must receive education to address rewarding good behavior, discipline, and limit-setting. Tutoring or behavioral therapy or both should be available for the child. If untreated, the disorder can have long-term serious effects into adolescence and adulthood. HOME CARE INSTRUCTIONS   Often with ADHD there is a lot of frustration among the family in dealing with the illness. There is often blame and anger that is not warranted. This is a life long illness. There is no way to prevent ADHD. In many cases, because the problem affects the family as a whole, the entire family may need help. A therapist can help the family find better ways to handle the disruptive behaviors and promote change. If the child is young, most of the therapist's work is with the parents. Parents will learn techniques for coping with and improving their child's behavior. Sometimes only the child with the ADHD needs counseling. Your caregivers can help   you make these decisions.  Children with ADHD may need help in organizing. Some helpful tips include:  Keep  routines the same every day from wake-up time to bedtime. Schedule everything. This includes homework and playtime. This should include outdoor and indoor recreation. Keep the schedule on the refrigerator or a bulletin board where it is frequently seen. Mark schedule changes as far in advance as possible.  Have a place for everything and keep everything in its place. This includes clothing, backpacks, and school supplies.  Encourage writing down assignments and bringing home needed books.  Offer your child a well-balanced diet. Breakfast is especially important for school performance. Children should avoid drinks with caffeine including:  Soft drinks.  Coffee.  Tea.  However, some older children (adolescents) may find these drinks helpful in improving their attention.  Children with ADHD need consistent rules that they can understand and follow. If rules are followed, give small rewards. Children with ADHD often receive, and expect, criticism. Look for good behavior and praise it. Set realistic goals. Give clear instructions. Look for activities that can foster success and self-esteem. Make time for pleasant activities with your child. Give lots of affection.  Parents are their children's greatest advocates. Learn as much as possible about ADHD. This helps you become a stronger and better advocate for your child. It also helps you educate your child's teachers and instructors if they feel inadequate in these areas. Parent support groups are often helpful. A national group with local chapters is called CHADD (Children and Adults with Attention Deficit Hyperactivity Disorder). PROGNOSIS  There is no cure for ADHD. Children with the disorder seldom outgrow it. Many find adaptive ways to accommodate the ADHD as they mature. SEEK MEDICAL CARE IF:  Your child has repeated muscle twitches, cough or speech outbursts.  Your child has sleep problems.  Your child has a marked loss of  appetite.  Your child develops depression.  Your child has new or worsening behavioral problems.  Your child develops dizziness.  Your child has a racing heart.  Your child has stomach pains.  Your child develops headaches. Document Released: 08/31/2002 Document Revised: 12/03/2011 Document Reviewed: 04/12/2008 Exit[employer] Patient Information 2014 [employer].  

## 2013-04-15 ENCOUNTER — Encounter: Payer: Self-pay | Admitting: Family Medicine

## 2013-04-15 ENCOUNTER — Ambulatory Visit (INDEPENDENT_AMBULATORY_CARE_PROVIDER_SITE_OTHER): Payer: PRIVATE HEALTH INSURANCE | Admitting: Family Medicine

## 2013-04-15 VITALS — BP 142/73 | HR 91 | Temp 97.6°F | Ht 65.0 in | Wt 192.0 lb

## 2013-04-15 DIAGNOSIS — F411 Generalized anxiety disorder: Secondary | ICD-10-CM

## 2013-04-15 DIAGNOSIS — F909 Attention-deficit hyperactivity disorder, unspecified type: Secondary | ICD-10-CM

## 2013-04-15 MED ORDER — CITALOPRAM HYDROBROMIDE 20 MG PO TABS: 20.0000 mg | ORAL_TABLET | Freq: Every day | ORAL | Status: DC

## 2013-04-15 MED ORDER — METHYLPHENIDATE HCL ER (LA) 30 MG PO CP24: 30.0000 mg | ORAL_CAPSULE | ORAL | Status: DC

## 2013-04-15 MED ORDER — ALPRAZOLAM 0.5 MG PO TABS: 0.5000 mg | ORAL_TABLET | Freq: Three times a day (TID) | ORAL | Status: DC | PRN

## 2013-04-15 NOTE — Patient Instructions (Signed)

## 2013-04-15 NOTE — Progress Notes (Signed)
  Subjective:    Patient ID: Vincent [name], male    DOB: Mar 10, 1986, 27 y.o.   MRN: [id number]  HPI Patient is here for follow up.  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  He has hx of anxiety and takes xanax and wants refill on his xanax. He states he gets stressed out at work and when he comes home from work.  He states he needs More xanax and wants to escalate.     Review of Systems No chest pain, SOB, HA, dizziness, vision change, N/V, diarrhea, constipation, dysuria, urinary urgency or frequency, myalgias, arthralgias or rash.     Objective:   Physical Exam  Vital signs noted  Well developed well nourished male.  HEENT - Head atraumatic Normocephalic                Eyes - PERRLA, Conjuctiva - clear Sclera- Clear EOMI                Ears - EAC's Wnl TM's Wnl Gross Hearing WNL                Nose - Nares patent                 Throat - oropharanx wnl Respiratory - Lungs CTA bilateral Cardiac - RRR S1 and S2 without murmur GI - Abdomen soft Nontender and bowel sounds active x 4 Extremities - No edema. Neuro - Grossly intact.      Assessment & Plan:  Adult ADHD - Plan: methylphenidate (RITALIN LA) 30 MG 24 hr capsule, DISCONTINUED: methylphenidate (RITALIN LA) 30 MG 24 hr capsule Try this new regimen Ritalin LA 30mg  po qd and stop Vyvanse.  GAD (generalized anxiety disorder) Xanax 0.5mg  one po tid prn anxiety #60w/3rf, discussed that he needs to use this prn as a rescue med for his anxiety and once he gets used to the celexa he needs to cut down on the xanax and use prn and not necessarily every day.  Discussed with patient that xanax is habit forming  And it is better not to escalate but to start on a maintenance medication.  Follow up in 2-3 months.

## 2013-05-12 ENCOUNTER — Telehealth: Payer: Self-pay | Admitting: Family Medicine

## 2013-05-12 ENCOUNTER — Other Ambulatory Visit: Payer: Self-pay | Admitting: Family Medicine

## 2013-05-13 ENCOUNTER — Other Ambulatory Visit: Payer: Self-pay | Admitting: Family Medicine

## 2013-05-13 ENCOUNTER — Telehealth: Payer: Self-pay | Admitting: Family Medicine

## 2013-05-13 DIAGNOSIS — F411 Generalized anxiety disorder: Secondary | ICD-10-CM

## 2013-05-13 DIAGNOSIS — F909 Attention-deficit hyperactivity disorder, unspecified type: Secondary | ICD-10-CM

## 2013-05-13 MED ORDER — AMPHETAMINE-DEXTROAMPHETAMINE 20 MG PO TABS: 20.0000 mg | ORAL_TABLET | Freq: Two times a day (BID) | ORAL | Status: DC

## 2013-05-13 MED ORDER — ALPRAZOLAM 0.5 MG PO TABS: 0.5000 mg | ORAL_TABLET | Freq: Three times a day (TID) | ORAL | Status: DC | PRN

## 2013-05-13 NOTE — Telephone Encounter (Signed)
Please follow up

## 2013-05-13 NOTE — Telephone Encounter (Signed)
He needs to follow up or get referral to psychiatry

## 2013-05-13 NOTE — Telephone Encounter (Signed)
Patient last seen in office on 04-15-13. Please advise. If approved please route to Pool A so nurse can phone in to pharmacy

## 2013-06-09 ENCOUNTER — Other Ambulatory Visit: Payer: Self-pay

## 2013-06-09 DIAGNOSIS — F909 Attention-deficit hyperactivity disorder, unspecified type: Secondary | ICD-10-CM

## 2013-06-09 NOTE — Telephone Encounter (Signed)
Last filled 05/13/13  Last seen 04/15/13  [facility] [place]  If approved print and route to nurse

## 2013-06-12 MED ORDER — AMPHETAMINE-DEXTROAMPHETAMINE 20 MG PO TABS: 20.0000 mg | ORAL_TABLET | Freq: Two times a day (BID) | ORAL | Status: DC

## 2013-06-12 NOTE — Telephone Encounter (Signed)
Last seen 04/15/13, last filled 05/13/13, Rx will print, call pt when ready

## 2013-06-12 NOTE — Telephone Encounter (Signed)
Patient notified rx up front 

## 2013-06-16 ENCOUNTER — Ambulatory Visit: Payer: PRIVATE HEALTH INSURANCE | Admitting: Family Medicine

## 2013-06-18 ENCOUNTER — Ambulatory Visit: Payer: PRIVATE HEALTH INSURANCE | Admitting: Family Medicine

## 2013-07-09 ENCOUNTER — Encounter: Payer: Self-pay | Admitting: Family Medicine

## 2013-07-09 ENCOUNTER — Ambulatory Visit (INDEPENDENT_AMBULATORY_CARE_PROVIDER_SITE_OTHER): Payer: PRIVATE HEALTH INSURANCE | Admitting: Family Medicine

## 2013-07-09 VITALS — BP 139/88 | HR 83 | Temp 97.2°F | Wt 182.0 lb

## 2013-07-09 DIAGNOSIS — F411 Generalized anxiety disorder: Secondary | ICD-10-CM

## 2013-07-09 DIAGNOSIS — F909 Attention-deficit hyperactivity disorder, unspecified type: Secondary | ICD-10-CM

## 2013-07-09 MED ORDER — AMPHETAMINE-DEXTROAMPHETAMINE 20 MG PO TABS: 20.0000 mg | ORAL_TABLET | Freq: Two times a day (BID) | ORAL | Status: DC

## 2013-07-09 MED ORDER — ALPRAZOLAM 0.5 MG PO TABS: 0.5000 mg | ORAL_TABLET | Freq: Three times a day (TID) | ORAL | Status: DC | PRN

## 2013-07-09 NOTE — Patient Instructions (Signed)
Attention Deficit Hyperactivity Disorder Attention deficit hyperactivity disorder (ADHD) is a problem with behavior issues based on the way the brain functions (neurobehavioral disorder). It is a common reason for behavior and academic problems in school. CAUSES  The cause of ADHD is unknown in most cases. It may run in families. It sometimes can be associated with learning disabilities and other behavioral problems. SYMPTOMS  There are 3 types of ADHD. The 3 types and some of the symptoms include:  Inattentive  Gets bored or distracted easily.  Loses or forgets things. Forgets to hand in homework.  Has trouble organizing or completing tasks.  Difficulty staying on task.  An inability to organize daily tasks and school work.  Leaving projects, chores, or homework unfinished.  Trouble paying attention or responding to details. Careless mistakes.  Difficulty following directions. Often seems like is not listening.  Dislikes activities that require sustained attention (like chores or homework).  Hyperactive-impulsive  Feels like it is impossible to sit still or stay in a seat. Fidgeting with hands and feet.  Trouble waiting turn.  Talking too much or out of turn. Interruptive.  Speaks or acts impulsively.  Aggressive, disruptive behavior.  Constantly busy or on the go, noisy.  Combined  Has symptoms of both of the above. Often children with ADHD feel discouraged about themselves and with school. They often perform well below their abilities in school. These symptoms can cause problems in home, school, and in relationships with peers. As children get older, the excess motor activities can calm down, but the problems with paying attention and staying organized persist. Most children do not outgrow ADHD but with good treatment can learn to cope with the symptoms. DIAGNOSIS  When ADHD is suspected, the diagnosis should be made by professionals trained in ADHD.  Diagnosis will  include:  Ruling out other reasons for the child's behavior.  The caregivers will check with the child's school and check their medical records.  They will talk to teachers and parents.  Behavior rating scales for the child will be filled out by those dealing with the child on a daily basis. A diagnosis is made only after all information has been considered. TREATMENT  Treatment usually includes behavioral treatment often along with medicines. It may include stimulant medicines. The stimulant medicines decrease impulsivity and hyperactivity and increase attention. Other medicines used include antidepressants and certain blood pressure medicines. Most experts agree that treatment for ADHD should address all aspects of the child's functioning. Treatment should not be limited to the use of medicines alone. Treatment should include structured classroom management. The parents must receive education to address rewarding good behavior, discipline, and limit-setting. Tutoring or behavioral therapy or both should be available for the child. If untreated, the disorder can have long-term serious effects into adolescence and adulthood. HOME CARE INSTRUCTIONS   Often with ADHD there is a lot of frustration among the family in dealing with the illness. There is often blame and anger that is not warranted. This is a life long illness. There is no way to prevent ADHD. In many cases, because the problem affects the family as a whole, the entire family may need help. A therapist can help the family find better ways to handle the disruptive behaviors and promote change. If the child is young, most of the therapist's work is with the parents. Parents will learn techniques for coping with and improving their child's behavior. Sometimes only the child with the ADHD needs counseling. Your caregivers can help   you make these decisions.  Children with ADHD may need help in organizing. Some helpful tips include:  Keep  routines the same every day from wake-up time to bedtime. Schedule everything. This includes homework and playtime. This should include outdoor and indoor recreation. Keep the schedule on the refrigerator or a bulletin board where it is frequently seen. Mark schedule changes as far in advance as possible.  Have a place for everything and keep everything in its place. This includes clothing, backpacks, and school supplies.  Encourage writing down assignments and bringing home needed books.  Offer your child a well-balanced diet. Breakfast is especially important for school performance. Children should avoid drinks with caffeine including:  Soft drinks.  Coffee.  Tea.  However, some older children (adolescents) may find these drinks helpful in improving their attention.  Children with ADHD need consistent rules that they can understand and follow. If rules are followed, give small rewards. Children with ADHD often receive, and expect, criticism. Look for good behavior and praise it. Set realistic goals. Give clear instructions. Look for activities that can foster success and self-esteem. Make time for pleasant activities with your child. Give lots of affection.  Parents are their children's greatest advocates. Learn as much as possible about ADHD. This helps you become a stronger and better advocate for your child. It also helps you educate your child's teachers and instructors if they feel inadequate in these areas. Parent support groups are often helpful. A national group with local chapters is called CHADD (Children and Adults with Attention Deficit Hyperactivity Disorder). PROGNOSIS  There is no cure for ADHD. Children with the disorder seldom outgrow it. Many find adaptive ways to accommodate the ADHD as they mature. SEEK MEDICAL CARE IF:  Your child has repeated muscle twitches, cough or speech outbursts.  Your child has sleep problems.  Your child has a marked loss of  appetite.  Your child develops depression.  Your child has new or worsening behavioral problems.  Your child develops dizziness.  Your child has a racing heart.  Your child has stomach pains.  Your child develops headaches. Document Released: 08/31/2002 Document Revised: 12/03/2011 Document Reviewed: 04/12/2008 Exit[employer] Patient Information 2014 [employer].  

## 2013-07-09 NOTE — Progress Notes (Signed)
  Subjective:    Patient ID: Vincent [name], male    DOB: [birth year], 26 y.o.   MRN: [id number]  HPI This 27 y.o. male presents for evaluation of follow up on ADD.  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  He needs Refill on his meds.   Review of Systems No chest pain, SOB, HA, dizziness, vision change, N/V, diarrhea, constipation, dysuria, urinary urgency or frequency, myalgias, arthralgias or rash.     Objective:   Physical Exam  Vital signs noted  Well developed well nourished male.  HEENT - Head atraumatic Normocephalic                Eyes - PERRLA, Conjuctiva - clear Sclera- Clear EOMI                Ears - EAC's Wnl TM's Wnl Gross Hearing WNL                Nose - Nares patent                 Throat - oropharanx wnl Respiratory - Lungs CTA bilateral Cardiac - RRR S1 and S2 without murmur GI - Abdomen soft Nontender and bowel sounds active x 4 Extremities - No edema. Neuro - Grossly intact.      Assessment & Plan:  Adult ADHD - Plan: amphetamine-dextroamphetamine (ADDERALL) 20 MG tablet  Anxiety state, unspecified - Plan: ALPRAZolam (XANAX) 0.5 MG tablet, DISCONTINUED: ALPRAZolam (XANAX) 0.5 MG tablet  Follow up in 4 months  Deatra Canter FNP

## 2013-07-21 ENCOUNTER — Ambulatory Visit: Payer: PRIVATE HEALTH INSURANCE

## 2013-07-31 ENCOUNTER — Telehealth: Payer: Self-pay | Admitting: Family Medicine

## 2013-08-03 ENCOUNTER — Other Ambulatory Visit: Payer: Self-pay | Admitting: Family Medicine

## 2013-08-03 DIAGNOSIS — F909 Attention-deficit hyperactivity disorder, unspecified type: Secondary | ICD-10-CM

## 2013-08-03 MED ORDER — AMPHETAMINE-DEXTROAMPHETAMINE 20 MG PO TABS: 20.0000 mg | ORAL_TABLET | Freq: Two times a day (BID) | ORAL | Status: DC

## 2013-08-03 NOTE — Telephone Encounter (Signed)
Left message on cell phone --rx ready for pick up

## 2013-08-24 ENCOUNTER — Telehealth: Payer: Self-pay | Admitting: Family Medicine

## 2013-08-25 ENCOUNTER — Other Ambulatory Visit: Payer: Self-pay | Admitting: Family Medicine

## 2013-08-25 DIAGNOSIS — F411 Generalized anxiety disorder: Secondary | ICD-10-CM

## 2013-08-25 DIAGNOSIS — F909 Attention-deficit hyperactivity disorder, unspecified type: Secondary | ICD-10-CM

## 2013-08-25 MED ORDER — ALPRAZOLAM 0.5 MG PO TABS: 0.5000 mg | ORAL_TABLET | Freq: Three times a day (TID) | ORAL | Status: DC | PRN

## 2013-08-25 MED ORDER — AMPHETAMINE-DEXTROAMPHETAMINE 20 MG PO TABS: 20.0000 mg | ORAL_TABLET | Freq: Two times a day (BID) | ORAL | Status: DC

## 2013-08-25 NOTE — Telephone Encounter (Signed)
Is coming in today to pay the whole bill at 430 please have is block off and meds ready to be picked up would really like to talk to someone in insurance

## 2013-08-25 NOTE — Telephone Encounter (Signed)
Patient is dismissed and cannot fill adderral

## 2013-09-07 ENCOUNTER — Ambulatory Visit (INDEPENDENT_AMBULATORY_CARE_PROVIDER_SITE_OTHER): Payer: PRIVATE HEALTH INSURANCE | Admitting: Family Medicine

## 2013-09-07 ENCOUNTER — Encounter: Payer: Self-pay | Admitting: Family Medicine

## 2013-09-07 VITALS — BP 145/80 | HR 88 | Temp 98.3°F | Ht 69.0 in | Wt 174.0 lb

## 2013-09-07 DIAGNOSIS — Z0000 Encounter for general adult medical examination without abnormal findings: Secondary | ICD-10-CM

## 2013-09-07 DIAGNOSIS — J069 Acute upper respiratory infection, unspecified: Secondary | ICD-10-CM

## 2013-09-07 LAB — POCT CBC
Granulocyte percent: 59.7 %G (ref 37–80)
HCT, POC: 44.5 % (ref 43.5–53.7)
Hemoglobin: 14.6 g/dL (ref 14.1–18.1)
Lymph, poc: 1.9 (ref 0.6–3.4)
MCH, POC: 29.2 pg (ref 27–31.2)
MCHC: 32.9 g/dL (ref 31.8–35.4)
MCV: 88.6 fL (ref 80–97)
MPV: 6.6 fL (ref 0–99.8)
POC Granulocyte: 3.6 (ref 2–6.9)
POC LYMPH PERCENT: 32.4 %L (ref 10–50)
Platelet Count, POC: 340 10*3/uL (ref 142–424)
RBC: 5 M/uL (ref 4.69–6.13)
RDW, POC: 12.8 %
WBC: 6 10*3/uL (ref 4.6–10.2)

## 2013-09-07 MED ORDER — AMOXICILLIN 875 MG PO TABS: 875.0000 mg | ORAL_TABLET | Freq: Two times a day (BID) | ORAL | Status: DC

## 2013-09-07 NOTE — Patient Instructions (Signed)

## 2013-09-07 NOTE — Progress Notes (Signed)
   Subjective:    Patient ID: [name], male    DOB: Sep 06, 1986, 27 y.o.   MRN: [id number]  HPI  This 27 y.o. male presents for evaluation of CPE.  He has been having some sore throat and URI sx's.  Review of Systems No chest pain, SOB, HA, dizziness, vision change, N/V, diarrhea, constipation, dysuria, urinary urgency or frequency, myalgias, arthralgias or rash.     Objective:   Physical Exam  Vital signs noted  Well developed well nourished male.  HEENT - Head atraumatic Normocephalic                Eyes - PERRLA, Conjuctiva - clear Sclera- Clear EOMI                Ears - EAC's Wnl TM's Wnl Gross Hearing WNL                Nose - Nares patent                 Throat - oropharanx wnl Respiratory - Lungs CTA bilateral Cardiac - RRR S1 and S2 without murmur GI - Abdomen soft Nontender and bowel sounds active x 4 Extremities - No edema. Neuro - Grossly intact.      Assessment & Plan:  URI (upper respiratory infection) - Plan: amoxicillin (AMOXIL) 875 MG tablet  Routine general medical examination at a health care facility - Plan: POCT CBC, CMP14+EGFR, Lipid panel, Thyroid Panel With TSH  Deatra Canter FNP

## 2013-09-08 ENCOUNTER — Other Ambulatory Visit: Payer: Self-pay | Admitting: Family Medicine

## 2013-09-08 LAB — CMP14+EGFR
ALT: 20 IU/L (ref 0–44)
AST: 19 IU/L (ref 0–40)
Albumin/Globulin Ratio: 2.1 (ref 1.1–2.5)
Albumin: 4.5 g/dL (ref 3.5–5.5)
Alkaline Phosphatase: 67 IU/L (ref 39–117)
BUN/Creatinine Ratio: 6 — ABNORMAL LOW (ref 8–19)
BUN: 6 mg/dL (ref 6–20)
CO2: 22 mmol/L (ref 18–29)
Calcium: 9.2 mg/dL (ref 8.7–10.2)
Chloride: 103 mmol/L (ref 97–108)
Creatinine, Ser: 1.05 mg/dL (ref 0.76–1.27)
GFR calc Af Amer: 111 mL/min/{1.73_m2} (ref >59)
GFR calc non Af Amer: 96 mL/min/{1.73_m2} (ref >59)
Globulin, Total: 2.1 g/dL (ref 1.5–4.5)
Glucose: 93 mg/dL (ref 65–99)
Potassium: 3.4 mmol/L — ABNORMAL LOW (ref 3.5–5.2)
Sodium: 144 mmol/L (ref 134–144)
Total Bilirubin: 0.4 mg/dL (ref 0.0–1.2)
Total Protein: 6.6 g/dL (ref 6.0–8.5)

## 2013-09-08 LAB — THYROID PANEL WITH TSH
Free Thyroxine Index: 2 (ref 1.2–4.9)
T3 Uptake Ratio: 32 % (ref 24–39)
T4, Total: 6.1 ug/dL (ref 4.5–12.0)
TSH: 2.75 u[IU]/mL (ref 0.450–4.500)

## 2013-09-08 LAB — LIPID PANEL
Chol/HDL Ratio: 3.4 ratio units (ref 0.0–5.0)
Cholesterol, Total: 182 mg/dL (ref 100–199)
HDL: 54 mg/dL (ref >39)
LDL Calculated: 114 mg/dL — ABNORMAL HIGH (ref 0–99)
Triglycerides: 72 mg/dL (ref 0–149)
VLDL Cholesterol Cal: 14 mg/dL (ref 5–40)

## 2013-09-22 ENCOUNTER — Telehealth: Payer: Self-pay | Admitting: Family Medicine

## 2013-09-29 ENCOUNTER — Other Ambulatory Visit: Payer: Self-pay | Admitting: Family Medicine

## 2013-09-29 DIAGNOSIS — F909 Attention-deficit hyperactivity disorder, unspecified type: Secondary | ICD-10-CM

## 2013-09-29 MED ORDER — AMPHETAMINE-DEXTROAMPHETAMINE 20 MG PO TABS
20.0000 mg | ORAL_TABLET | Freq: Two times a day (BID) | ORAL | Status: DC
Start: 2013-09-29 — End: 2013-10-19

## 2013-10-02 ENCOUNTER — Telehealth: Payer: Self-pay | Admitting: *Deleted

## 2013-10-12 ENCOUNTER — Encounter: Payer: Self-pay | Admitting: *Deleted

## 2013-10-19 ENCOUNTER — Other Ambulatory Visit: Payer: Self-pay | Admitting: Family Medicine

## 2013-10-19 ENCOUNTER — Telehealth: Payer: Self-pay | Admitting: Family Medicine

## 2013-10-19 DIAGNOSIS — F909 Attention-deficit hyperactivity disorder, unspecified type: Secondary | ICD-10-CM

## 2013-10-19 MED ORDER — AMPHETAMINE-DEXTROAMPHETAMINE 20 MG PO TABS
20.0000 mg | ORAL_TABLET | Freq: Two times a day (BID) | ORAL | Status: DC
Start: 2013-10-19 — End: 2013-10-26

## 2013-10-26 ENCOUNTER — Other Ambulatory Visit: Payer: Self-pay | Admitting: Family Medicine

## 2013-10-26 DIAGNOSIS — F909 Attention-deficit hyperactivity disorder, unspecified type: Secondary | ICD-10-CM

## 2013-10-26 MED ORDER — AMPHETAMINE-DEXTROAMPHETAMINE 20 MG PO TABS
20.0000 mg | ORAL_TABLET | Freq: Two times a day (BID) | ORAL | Status: DC
Start: 2013-10-26 — End: 2013-11-18

## 2013-10-27 NOTE — Telephone Encounter (Signed)
Message left that RX for Adderall is ready for pick up RX to front

## 2013-11-09 ENCOUNTER — Ambulatory Visit: Payer: PRIVATE HEALTH INSURANCE | Admitting: Family Medicine

## 2013-11-18 ENCOUNTER — Ambulatory Visit (INDEPENDENT_AMBULATORY_CARE_PROVIDER_SITE_OTHER): Payer: PRIVATE HEALTH INSURANCE | Admitting: Family Medicine

## 2013-11-18 ENCOUNTER — Encounter: Payer: Self-pay | Admitting: Family Medicine

## 2013-11-18 VITALS — BP 135/83 | HR 91 | Temp 98.3°F | Ht 69.0 in | Wt 172.0 lb

## 2013-11-18 DIAGNOSIS — F909 Attention-deficit hyperactivity disorder, unspecified type: Secondary | ICD-10-CM

## 2013-11-18 DIAGNOSIS — F411 Generalized anxiety disorder: Secondary | ICD-10-CM

## 2013-11-18 MED ORDER — AMPHETAMINE-DEXTROAMPHETAMINE 20 MG PO TABS: 20.0000 mg | ORAL_TABLET | Freq: Two times a day (BID) | ORAL | Status: DC

## 2013-11-18 MED ORDER — AMPHETAMINE-DEXTROAMPHETAMINE 20 MG PO TABS
20.0000 mg | ORAL_TABLET | Freq: Two times a day (BID) | ORAL | Status: DC
Start: 2013-12-16 — End: 2013-11-18

## 2013-11-18 MED ORDER — ALPRAZOLAM 0.5 MG PO TABS: 0.5000 mg | ORAL_TABLET | Freq: Three times a day (TID) | ORAL | Status: DC | PRN

## 2013-11-18 NOTE — Patient Instructions (Signed)
Attention Deficit Hyperactivity Disorder Attention deficit hyperactivity disorder (ADHD) is a problem with behavior issues based on the way the brain functions (neurobehavioral disorder). It is a common reason for behavior and academic problems in school. SYMPTOMS  There are 3 types of ADHD. The 3 types and some of the symptoms include:  Inattentive  Gets bored or distracted easily.  Loses or forgets things. Forgets to hand in homework.  Has trouble organizing or completing tasks.  Difficulty staying on task.  An inability to organize daily tasks and school work.  Leaving projects, chores, or homework unfinished.  Trouble paying attention or responding to details. Careless mistakes.  Difficulty following directions. Often seems like is not listening.  Dislikes activities that require sustained attention (like chores or homework).  Hyperactive-impulsive  Feels like it is impossible to sit still or stay in a seat. Fidgeting with hands and feet.  Trouble waiting turn.  Talking too much or out of turn. Interruptive.  Speaks or acts impulsively.  Aggressive, disruptive behavior.  Constantly busy or on the go, noisy.  Often leaves seat when they are expected to remain seated.  Often runs or climbs where it is not appropriate, or feels very restless.  Combined  Has symptoms of both of the above. Often children with ADHD feel discouraged about themselves and with school. They often perform well below their abilities in school. As children get older, the excess motor activities can calm down, but the problems with paying attention and staying organized persist. Most children do not outgrow ADHD but with good treatment can learn to cope with the symptoms. DIAGNOSIS  When ADHD is suspected, the diagnosis should be made by professionals trained in ADHD. This professional will collect information about the individual suspected of having ADHD. Information must be collected from  various settings where the person lives, works, or attends school.  Diagnosis will include:  Confirming symptoms began in childhood.  Ruling out other reasons for the child's behavior.  The health care providers will check with the child's school and check their medical records.  They will talk to teachers and parents.  Behavior rating scales for the child will be filled out by those dealing with the child on a daily basis. A diagnosis is made only after all information has been considered. TREATMENT  Treatment usually includes behavioral treatment, tutoring or extra support in school, and stimulant medicines. Because of the way a person's brain works with ADHD, these medicines decrease impulsivity and hyperactivity and increase attention. This is different than how they would work in a person who does not have ADHD. Other medicines used include antidepressants and certain blood pressure medicines. Most experts agree that treatment for ADHD should address all aspects of the person's functioning. Along with medicines, treatment should include structured classroom management at school. Parents should reward good behavior, provide constant discipline, and limit-setting. Tutoring should be available for the child as needed. ADHD is a life-long condition. If untreated, the disorder can have long-term serious effects into adolescence and adulthood. HOME CARE INSTRUCTIONS   Often with ADHD there is a lot of frustration among family members dealing with the condition. Blame and anger are also feelings that are common. In many cases, because the problem affects the family as a whole, the entire family may need help. A therapist can help the family find better ways to handle the disruptive behaviors of the person with ADHD and promote change. If the person with ADHD is young, most of the therapist's work   is with the parents. Parents will learn techniques for coping with and improving their child's  behavior. Sometimes only the child with the ADHD needs counseling. Your health care providers can help you make these decisions.  Children with ADHD may need help learning how to organize. Some helpful tips include:  Keep routines the same every day from wake-up time to bedtime. Schedule all activities, including homework and playtime. Keep the schedule in a place where the person with ADHD will often see it. Mark schedule changes as far in advance as possible.  Schedule outdoor and indoor recreation.  Have a place for everything and keep everything in its place. This includes clothing, backpacks, and school supplies.  Encourage writing down assignments and bringing home needed books. Work with your child's teachers for assistance in organizing school work.  Offer your child a well-balanced diet. Breakfast that includes a balance of whole grains, protein and, fruits or vegetables is especially important for school performance. Children should avoid drinks with caffeine including:  Soft drinks.  Coffee.  Tea.  However, some older children (adolescents) may find these drinks helpful in improving their attention. Because it can also be common for adolescents with ADHD to become addicted to caffeine, talk with your health care provider about what is a safe amount of caffeine intake for your child.  Children with ADHD need consistent rules that they can understand and follow. If rules are followed, give small rewards. Children with ADHD often receive, and expect, criticism. Look for good behavior and praise it. Set realistic goals. Give clear instructions. Look for activities that can foster success and self-esteem. Make time for pleasant activities with your child. Give lots of affection.  Parents are their children's greatest advocates. Learn as much as possible about ADHD. This helps you become a stronger and better advocate for your child. It also helps you educate your child's teachers and  instructors if they feel inadequate in these areas. Parent support groups are often helpful. A national group with local chapters is called Children and Adults with Attention Deficit Hyperactivity Disorder (CHADD). SEEK MEDICAL CARE IF:  Your child has repeated muscle twitches, cough or speech outbursts.  Your child has sleep problems.  Your child has a marked loss of appetite.  Your child develops depression.  Your child has new or worsening behavioral problems.  Your child develops dizziness.  Your child has a racing heart.  Your child has stomach pains.  Your child develops headaches. SEEK IMMEDIATE MEDICAL CARE IF:  Your child has been diagnosed with depression or anxiety and the symptoms seem to be getting worse.  Your child has been depressed and suddenly appears to have increased energy or motivation.  You are worried that your child is having a bad reaction to a medication he or she is taking for ADHD. Document Released: 08/31/2002 Document Revised: 07/01/2013 Document Reviewed: 05/18/2013 [employer] Patient Information 2014 [employer].  

## 2013-11-18 NOTE — Progress Notes (Signed)
   Subjective:    Patient ID: Vincent [name], male    DOB: 08-Dec-1985, 28 y.o.   MRN: [id number]  HPI  This 28 y.o. male presents for evaluation of follow up visit.  He has hx of ADD, anxiety, and Tobacco abuse.  He has been trying to quit.  He has been considering stopping smoking. He has been doing well with his job and he is able to focusing with his marriage and his Job.  Review of Systems    No chest pain, SOB, HA, dizziness, vision change, N/V, diarrhea, constipation, dysuria, urinary urgency or frequency, myalgias, arthralgias or rash.  Objective:   Physical Exam Vital signs noted  Well developed well nourished male.  HEENT - Head atraumatic Normocephalic                Eyes - PERRLA, Conjuctiva - clear Sclera- Clear EOMI                Ears - EAC's Wnl TM's Wnl Gross Hearing WNL                Nose - Nares patent                 Throat - oropharanx wnl Respiratory - Lungs CTA bilateral Cardiac - RRR S1 and S2 without murmur GI - Abdomen soft Nontender and bowel sounds active x 4 Extremities - No edema. Neuro - Grossly intact.       Assessment & Plan:  Adult ADHD - Plan: amphetamine-dextroamphetamine (ADDERALL) 20 MG tablet, DISCONTINUED: amphetamine-dextroamphetamine (ADDERALL) 20 MG tablet, DISCONTINUED: amphetamine-dextroamphetamine (ADDERALL) 20 MG tablet  Anxiety state, unspecified - Plan: ALPRAZolam (XANAX) 0.5 MG tablet  Follow up in 3 months  Deatra CanterWilliam J Lakai Moree FNP

## 2013-12-29 ENCOUNTER — Telehealth: Payer: Self-pay | Admitting: Family Medicine

## 2013-12-29 NOTE — Telephone Encounter (Signed)
Call patient and tell him that I am unable to rx him anymore adderrall because the new protocol at our office Is for psychiatry to rx adderrall due to the misuse and abuse of this controlled substance.  I am not comfortable Prescribing this to him and am unable to rx this to him per doctor moore my supervising physician

## 2013-12-29 NOTE — Telephone Encounter (Addendum)
Wife is on Release of Information. She is concerned because she thinks Adderall is affecting his mood and personality. He is short tempered, doesn't sleep, and is not eating. He has had significant weight loss. She does not want him to know that she called us.  Told wife that there isn't a lot I can do without him knowing that she called. I may be able to call and tell him it's time for a follow up appt. He doesn't have one scheduled but he does have a current prescription so this may see suspicious.  She understands to call 911 if she becomes concerned that he is a threat to himself or others.   Any thoughts?

## 2014-02-16 ENCOUNTER — Telehealth: Payer: Self-pay | Admitting: Family Medicine

## 2014-02-17 ENCOUNTER — Other Ambulatory Visit: Payer: Self-pay | Admitting: Family Medicine

## 2014-02-17 DIAGNOSIS — F411 Generalized anxiety disorder: Secondary | ICD-10-CM

## 2014-02-17 DIAGNOSIS — F909 Attention-deficit hyperactivity disorder, unspecified type: Secondary | ICD-10-CM

## 2014-02-17 MED ORDER — AMPHETAMINE-DEXTROAMPHETAMINE 20 MG PO TABS: 20.0000 mg | ORAL_TABLET | Freq: Two times a day (BID) | ORAL | Status: DC

## 2014-02-17 MED ORDER — ALPRAZOLAM 0.5 MG PO TABS: 0.5000 mg | ORAL_TABLET | Freq: Three times a day (TID) | ORAL | Status: DC | PRN

## 2014-02-17 NOTE — Telephone Encounter (Signed)
30 days of xanax and adderall ready to pick up and will need to follow up for next refill

## 2014-02-17 NOTE — Telephone Encounter (Signed)
Patient aware.

## 2014-03-18 ENCOUNTER — Other Ambulatory Visit: Payer: Self-pay | Admitting: Family Medicine

## 2014-03-18 NOTE — Telephone Encounter (Signed)
Patient last seen in office on 11-18-13. Rx last filled on 02-17-14 for #[id number]. Please advise. If approved please route to Pool A so nurse can phone in to pharmacy

## 2014-03-22 ENCOUNTER — Other Ambulatory Visit: Payer: Self-pay | Admitting: Family Medicine

## 2014-03-22 ENCOUNTER — Telehealth: Payer: Self-pay | Admitting: Family Medicine

## 2014-03-22 DIAGNOSIS — F909 Attention-deficit hyperactivity disorder, unspecified type: Secondary | ICD-10-CM

## 2014-03-22 MED ORDER — AMPHETAMINE-DEXTROAMPHETAMINE 20 MG PO TABS: 20.0000 mg | ORAL_TABLET | Freq: Two times a day (BID) | ORAL | Status: DC

## 2014-03-22 NOTE — Telephone Encounter (Signed)
Can come p/u rx of adderall

## 2014-03-23 NOTE — Telephone Encounter (Signed)
Patient aware rx ready to be picked up 

## 2014-05-08 ENCOUNTER — Telehealth: Payer: Self-pay | Admitting: Family Medicine

## 2014-05-08 NOTE — Telephone Encounter (Signed)
Spoke with patient and advised patient that he really needs to call his psychiatrist and speak with them. That he made need some adjustments in his medication and he states that they have him on levetiracetam and  latuda and that he feels like the manic episodes have decreased but he still is complaining with a severe amount of insomnia. Patient also stated that his wife thought his adderral dosage was too high and that he flushed them down the toilet due to there argument. Patient advised that he really needed to contact his psychiatrist to see what they recommend that he may need his Bipolar medications adjusted. Patient understood and stated he would contact psychiatrist. Patient also told that if his episodes got worse that [facility] needed [facility] . I was on phone with patient for 20 minutes.

## 2014-05-21 ENCOUNTER — Ambulatory Visit: Payer: PRIVATE HEALTH INSURANCE | Admitting: Family Medicine

## 2014-06-07 ENCOUNTER — Telehealth: Payer: Self-pay | Admitting: Family Medicine

## 2014-06-07 ENCOUNTER — Telehealth: Payer: Self-pay | Admitting: Nurse Practitioner

## 2014-06-07 NOTE — Telephone Encounter (Signed)
Patient was offered appt for Monday but needs sooner apppt. Patient advised to check daily

## 2014-06-25 ENCOUNTER — Ambulatory Visit: Payer: PRIVATE HEALTH INSURANCE | Admitting: Family

## 2014-09-09 ENCOUNTER — Encounter: Payer: PRIVATE HEALTH INSURANCE | Admitting: Family Medicine

## 2014-09-13 ENCOUNTER — Telehealth: Payer: Self-pay | Admitting: Family Medicine

## 2014-09-14 NOTE — Telephone Encounter (Signed)
[name]

## 2014-09-22 ENCOUNTER — Ambulatory Visit: Payer: PRIVATE HEALTH INSURANCE | Admitting: Family Medicine

## 2016-10-22 ENCOUNTER — Telehealth: Payer: Self-pay | Admitting: *Deleted

## 2016-10-22 NOTE — Telephone Encounter (Signed)
Open error 

## 2017-05-29 ENCOUNTER — Encounter: Payer: Self-pay | Admitting: Family Medicine

## 2017-05-29 ENCOUNTER — Ambulatory Visit (INDEPENDENT_AMBULATORY_CARE_PROVIDER_SITE_OTHER): Payer: BLUE CROSS/BLUE SHIELD | Admitting: Family Medicine

## 2017-05-29 ENCOUNTER — Ambulatory Visit (INDEPENDENT_AMBULATORY_CARE_PROVIDER_SITE_OTHER): Payer: BLUE CROSS/BLUE SHIELD

## 2017-05-29 VITALS — BP 161/81 | HR 98 | Temp 98.4°F | Ht 69.0 in | Wt 224.0 lb

## 2017-05-29 DIAGNOSIS — M62830 Muscle spasm of back: Secondary | ICD-10-CM

## 2017-05-29 DIAGNOSIS — R030 Elevated blood-pressure reading, without diagnosis of hypertension: Secondary | ICD-10-CM | POA: Insufficient documentation

## 2017-05-29 DIAGNOSIS — M545 Low back pain: Secondary | ICD-10-CM

## 2017-05-29 DIAGNOSIS — M4125 Other idiopathic scoliosis, thoracolumbar region: Secondary | ICD-10-CM | POA: Insufficient documentation

## 2017-05-29 DIAGNOSIS — M5442 Lumbago with sciatica, left side: Secondary | ICD-10-CM

## 2017-05-29 DIAGNOSIS — G8929 Other chronic pain: Secondary | ICD-10-CM | POA: Insufficient documentation

## 2017-05-29 MED ORDER — TIZANIDINE HCL 4 MG PO CAPS: 4.0000 mg | ORAL_CAPSULE | Freq: Three times a day (TID) | ORAL | 0 refills | Status: DC

## 2017-05-29 MED ORDER — DICLOFENAC SODIUM 75 MG PO TBEC: 75.0000 mg | DELAYED_RELEASE_TABLET | Freq: Two times a day (BID) | ORAL | 0 refills | Status: DC

## 2017-05-29 NOTE — Assessment & Plan Note (Signed)
His exam was notable for a scoliotic curve near the thoracolumbar junction. He also had increased muscle tonicity on the right with associated tenderness to palpation at the L4-5 level. He had a negative straight leg raise. No red flags to suggest cauda equina. Because of history of trauma, Lumbar plain films were obtained today. No evidence of acute fracture upon personal review of image. Disc spaces appear to be preserved. He does have a scoliotic curve that is also appreciated on lumbar films. Again I think that left-sided low back pain secondary to muscle spasm and compression of the sciatic nerve. I prescribed him Voltaren 75 mg to take by mouth twice a day with a meal. I have also prescribed him an anti-spasmodic, Zanaflex 4 mg to take every 8 hours as needed. I did caution sedation and advised him to avoid operating heavy machinery while taking this medication. I provided him a note for work to excuse him for today. I also gave him work restrictions with a 10 pound lifting, pushing, pulling restriction. Though have concerned that he will not comply with this. We discussed that opiate medications were not good long-term treatments for low back pain and cited current CDC recommendations. Patient voiced good understanding. Of note, [place] narcotic database was reviewed and there were no red flags. If pain persists beyond 3 months from start, will refer to orthopedics +/- obtain MRI. Strict return precautions were reviewed. Patient will follow-up in one week for reevaluation.

## 2017-05-29 NOTE — Assessment & Plan Note (Signed)
Persistently elevated systolic blood pressure upon repeat. He has no known history of hypertension. Though, he has been lost to follow-up for 3 years. Elevated blood pressures may be an objective indicator of his low back pain. Cannot rule out undiagnosed hypertension. We'll plan to pay close attention to this at next appointment. If persistently elevated, we'll consider initiation of antihypertensives and fasting labs.
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
## 2017-05-29 NOTE — Assessment & Plan Note (Signed)
Noted on today's exam and echoed on his back x-ray today. Difficult to say whether or not this is an acquired scoliosis versus congenital. Either way, angle curvature is small and not compromising cardiac or pulmonary function at this time. We'll continue to follow closely.

## 2017-05-29 NOTE — Patient Instructions (Signed)
As we discussed, keep active but do not to perform activities that worsen your back pain. I have written work restrictions for you with a restriction of the maximum 10 pound lifting, pushing, pulling. I prescribed a muscle relaxer (Zanaflex) and pain medication (Voltaren) for you today. The Zanaflex can cause sedation. Do not operate heavy machinery while taking. The Voltaren can cause stomach upset. Please make sure to eat a meal with this. Do not take any other NSAIDs while on this medication. These include Motrin, ibuprofen, Advil, naproxen, Aleve. You may use a topical analgesic such as Biofreeze, BenGay, Tiger balm. I recommend alternating ice and heat to the painful areas. If you develop numbness in your groin, urinary retention, fecal incontinence, worsening back pain, or weakness please seek immediate medical attention. Otherwise plan to follow up in one week for recheck.  Low Back Sprain Rehab Ask your health care provider which exercises are safe for you. Do exercises exactly as told by your health care provider and adjust them as directed. It is normal to feel mild stretching, pulling, tightness, or discomfort as you do these exercises, but you should stop right away if you feel sudden pain or your pain gets worse. Do not begin these exercises until told by your health care provider. Stretching and range of motion exercises These exercises warm up your muscles and joints and improve the movement and flexibility of your back. These exercises also help to relieve pain, numbness, and tingling. Exercise A: Lumbar rotation  1. Lie on your back on a firm surface and bend your knees. 2. Straighten your arms out to your sides so each arm forms an "L" shape with a side of your body (a 90 degree angle). 3. Slowly move both of your knees to one side of your body until you feel a stretch in your lower back. Try not to let your shoulders move off of the floor. 4. Hold for __________ seconds. 5. Tense your  abdominal muscles and slowly move your knees back to the starting position. 6. Repeat this exercise on the other side of your body. Repeat __________ times. Complete this exercise __________ times a day. Exercise B: Prone extension on elbows  1. Lie on your abdomen on a firm surface. 2. Prop yourself up on your elbows. 3. Use your arms to help lift your chest up until you feel a gentle stretch in your abdomen and your lower back. ? This will place some of your body weight on your elbows. If this is uncomfortable, try stacking pillows under your chest. ? Your hips should stay down, against the surface that you are lying on. Keep your hip and back muscles relaxed. 4. Hold for __________ seconds. 5. Slowly relax your upper body and return to the starting position. Repeat __________ times. Complete this exercise __________ times a day. Strengthening exercises These exercises build strength and endurance in your back. Endurance is the ability to use your muscles for a long time, even after they get tired. Exercise C: Pelvic tilt 1. Lie on your back on a firm surface. Bend your knees and keep your feet flat. 2. Tense your abdominal muscles. Tip your pelvis up toward the ceiling and flatten your lower back into the floor. ? To help with this exercise, you may place a small towel under your lower back and try to push your back into the towel. 3. Hold for __________ seconds. 4. Let your muscles relax completely before you repeat this exercise. Repeat __________ times. Complete  this exercise __________ times a day. Exercise D: Alternating arm and leg raises  1. Get on your hands and knees on a firm surface. If you are on a hard floor, you may want to use padding to cushion your knees, such as an exercise mat. 2. Line up your arms and legs. Your hands should be below your shoulders, and your knees should be below your hips. 3. Lift your left leg behind you. At the same time, raise your right arm and  straighten it in front of you. ? Do not lift your leg higher than your hip. ? Do not lift your arm higher than your shoulder. ? Keep your abdominal and back muscles tight. ? Keep your hips facing the ground. ? Do not arch your back. ? Keep your balance carefully, and do not hold your breath. 4. Hold for __________ seconds. 5. Slowly return to the starting position and repeat with your right leg and your left arm. Repeat __________ times. Complete this exercise __________ times a day. Exercise E: Abdominal set with straight leg raise  1. Lie on your back on a firm surface. 2. Bend one of your knees and keep your other leg straight. 3. Tense your abdominal muscles and lift your straight leg up, 4-6 inches (10-15 cm) off the ground. 4. Keep your abdominal muscles tight and hold for __________ seconds. ? Do not hold your breath. ? Do not arch your back. Keep it flat against the ground. 5. Keep your abdominal muscles tense as you slowly lower your leg back to the starting position. 6. Repeat with your other leg. Repeat __________ times. Complete this exercise __________ times a day. Posture and body mechanics  Body mechanics refers to the movements and positions of your body while you do your daily activities. Posture is part of body mechanics. Good posture and healthy body mechanics can help to relieve stress in your body's tissues and joints. Good posture means that your spine is in its natural S-curve position (your spine is neutral), your shoulders are pulled back slightly, and your head is not tipped forward. The following are general guidelines for applying improved posture and body mechanics to your everyday activities. Standing   When standing, keep your spine neutral and your feet about hip-width apart. Keep a slight bend in your knees. Your ears, shoulders, and hips should line up.  When you do a task in which you stand in one place for a long time, place one foot up on a stable  object that is 2-4 inches (5-10 cm) high, such as a footstool. This helps keep your spine neutral. Sitting   When sitting, keep your spine neutral and keep your feet flat on the floor. Use a footrest, if necessary, and keep your thighs parallel to the floor. Avoid rounding your shoulders, and avoid tilting your head forward.  When working at a desk or a computer, keep your desk at a height where your hands are slightly lower than your elbows. Slide your chair under your desk so you are close enough to maintain good posture.  When working at a computer, place your monitor at a height where you are looking straight ahead and you do not have to tilt your head forward or downward to look at the screen. Resting   When lying down and resting, avoid positions that are most painful for you.  If you have pain with activities such as sitting, bending, stooping, or squatting (flexion-based activities), lie in a position in  which your body does not bend very much. For example, avoid curling up on your side with your arms and knees near your chest (fetal position).  If you have pain with activities such as standing for a long time or reaching with your arms (extension-based activities), lie with your spine in a neutral position and bend your knees slightly. Try the following positions:  Lying on your side with a pillow between your knees.  Lying on your back with a pillow under your knees. Lifting   When lifting objects, keep your feet at least shoulder-width apart and tighten your abdominal muscles.  Bend your knees and hips and keep your spine neutral. It is important to lift using the strength of your legs, not your back. Do not lock your knees straight out.  Always ask for help to lift heavy or awkward objects. This information is not intended to replace advice given to you by your health care provider. Make sure you discuss any questions you have with your health care provider. Document  Released: 09/10/2005 Document Revised: 05/17/2016 Document Reviewed: 06/22/2015 Elsevier Interactive Patient Education  [employer]2018 [employer]

## 2017-06-03 NOTE — Progress Notes (Deleted)
Subjective: CC: left sided low back pain, elevated BP reading HPI: Vincent [name] is a 31 y.o. male presenting to clinic today for:  1. Back pain Patient seen in clinic one week ago for acute onset of left-sided low back pain after a fall 3 weeks prior. Pain was a 7 out of 10 at that visit. He had lumbar films that showed no acute abnormalities, including fractures. He did have a scoliotic curve to the left on exam and on x-ray. He was treated as a back spasm/low back pain with associated sciatica. He was prescribed Zanaflex 3 times a day and Voltaren.  He was also placed on a 10 pound lifting, pushing, pulling work restriction. He notes that pain***. *** Numbness, tingling, weakness, falls, saddle anesthesia, urinary retention or fecal incontinence.  2. Elevated blood pressure reading Patient noted to have elevated blood pressure reading at last visit. He has no formal diagnosis of hypertension. Elevated blood pressure was thought to be secondary to low back pain. *** Chest pain, shortness of breath, dizziness, visual disturbances, headache, lower extremity edema.  Past Medical History:  Diagnosis Date  . ADHD (attention deficit hyperactivity disorder)   . Anxiety   . Asthma    Past Surgical History:  Procedure Laterality Date  . MANDIBLE SURGERY  2009  . VASECTOMY  2007   Social History   Social History  . Marital status: Married    Spouse name: N/A  . Number of children: N/A  . Years of education: N/A   Occupational History  . Not on file.   Social History Main Topics  . Smoking status: Former Smoker    Packs/day: 0.50  . Smokeless tobacco: Never Used  . Alcohol use No  . Drug use: No  . Sexual activity: Yes    Birth control/ protection: Surgical   Other Topics Concern  . 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   Family History  Problem Relation Age of Onset  . Family history unknown: Yes   Allergies  Allergen Reactions  . Tigan [Trimethobenzamide Hcl]    ROS: Per HPI  Objective: Office vital signs reviewed. There were no vitals taken for this visit.  Physical Examination:  General: Awake, alert, well nourished, poorly groomed, no acute distress but appears uncomfortable Neck: no JVD, no carotid bruits Cardio: regular regular rate and rhythm; no murmurs, rubs or gallops, +2 radial pulses Pulm: Clear to auscultation bilaterally; no wheezes, rhonchi, rales. normal work of breathing on room air Extremities: warm, well perfused, No edema, cyanosis or clubbing; +2 pulses bilaterally MSK: antalgic gait and normal station  Spine: Active range of motion is limited in both flexion and extension secondary to pain. He has slight decrease in range of motion testing with left rotation of the lumbar spine compared to the right. No midline tenderness to the entire spine. He does have a scoliotic curve most notable at the thoracolumbar junction that curves to the left. He does have increased muscle tone at the L4-5 level on the right. He is also tender to palpation to paraspinal muscles bilaterally at this level. Negative straight leg raise. Skin: dry; intact; no rashes or lesions *** Neuro: 5/5 Strength and light touch sensation grossly intact, patellar DTRs 1/4  No results found.  Assessment/ Plan: 31 y.o. male  No problem-specific Assessment & Plan notes found for this encounter.   Raliegh IpAshly M Charlaine Utsey, DO [facility] [phone]

## 2017-06-05 ENCOUNTER — Ambulatory Visit: Payer: BLUE CROSS/BLUE SHIELD | Admitting: Family Medicine

## 2017-06-10 ENCOUNTER — Other Ambulatory Visit: Payer: Self-pay | Admitting: Family Medicine

## 2017-06-10 DIAGNOSIS — M545 Low back pain: Secondary | ICD-10-CM

## 2017-06-10 DIAGNOSIS — M62830 Muscle spasm of back: Secondary | ICD-10-CM

## 2017-07-02 ENCOUNTER — Other Ambulatory Visit: Payer: Self-pay | Admitting: Family Medicine

## 2017-07-02 DIAGNOSIS — M62830 Muscle spasm of back: Secondary | ICD-10-CM

## 2017-07-02 DIAGNOSIS — M545 Low back pain: Secondary | ICD-10-CM

## 2017-08-02 ENCOUNTER — Ambulatory Visit: Payer: BLUE CROSS/BLUE SHIELD | Admitting: Family Medicine

## 2017-08-02 ENCOUNTER — Encounter: Payer: Self-pay | Admitting: Family Medicine

## 2017-08-02 VITALS — BP 154/98 | HR 98 | Temp 98.4°F | Ht 69.0 in | Wt 222.0 lb

## 2017-08-02 DIAGNOSIS — G8929 Other chronic pain: Secondary | ICD-10-CM

## 2017-08-02 DIAGNOSIS — M62830 Muscle spasm of back: Secondary | ICD-10-CM | POA: Diagnosis not present

## 2017-08-02 DIAGNOSIS — R030 Elevated blood-pressure reading, without diagnosis of hypertension: Secondary | ICD-10-CM

## 2017-08-02 DIAGNOSIS — M5442 Lumbago with sciatica, left side: Secondary | ICD-10-CM

## 2017-08-02 DIAGNOSIS — G479 Sleep disorder, unspecified: Secondary | ICD-10-CM | POA: Diagnosis not present

## 2017-08-02 MED ORDER — TIZANIDINE HCL 4 MG PO CAPS: 4.0000 mg | ORAL_CAPSULE | Freq: Three times a day (TID) | ORAL | 0 refills | Status: DC

## 2017-08-02 MED ORDER — DICLOFENAC SODIUM 75 MG PO TBEC: 75.0000 mg | DELAYED_RELEASE_TABLET | Freq: Two times a day (BID) | ORAL | 0 refills | Status: DC

## 2017-08-02 MED ORDER — TRAZODONE HCL 50 MG PO TABS: 25.0000 mg | ORAL_TABLET | Freq: Every evening | ORAL | 0 refills | Status: DC | PRN

## 2017-08-02 NOTE — Assessment & Plan Note (Signed)
Would consider this now a chronic issue.  He is still within the 8369-month window of onset.  I did discuss consideration for MRI of his back and referral to orthopedics.  He is not interested in surgery or invasive procedures at this time but is willing to see a physical therapist for rehab.  I [place].  We did discuss consideration for chiropractic medicine.  I did review with him that he may consider seeing physical therapy prior to pursuing chiropractic medicine.  Refills provided for the Voltaren and the Zanaflex.  Weight loss is encouraged.  He will follow-up in 1 month.

## 2017-08-02 NOTE — Assessment & Plan Note (Signed)
It sounds like he is worked on with sleep hygiene quite a bit.  I do agree that benzodiazepines are not a great option for long-term treatment of insomnia.  I did recommend reduction of soda.  Handout was given for continued sleep hygiene improvement.  I did offer Belsomra versus trazodone.  Because patient has tried trazodone in the past he would like to revisit this medication.  I prescribed this at 50 mg nightly.  He may cut this in half to 25 mg if he experiences excessive sedation in the morning.  He will follow-up in 4 weeks.

## 2017-08-02 NOTE — Patient Instructions (Addendum)
I have refilled your medications for your back.  Take these only if needed.  I have also placed a referral to physical therapy.  If you find that physical therapy is not helping sufficiently, let me know and I will see if we can get copies of your x-rays to send to the chiropractor.  Weight loss will be key in helping your low back pain.  Strengthening your core will be essential as well.  For your sleep, I have prescribed trazodone.  He may take 1/2-1 tablet at nighttime.  Plan to take this about 30 minutes before going to sleep.  Call me next week to let me know how things are going.  As we discussed, your blood pressure still elevated.  Given your family history of heart disease, it is important that your blood pressure is controlled.  Because we cannot tell if this is from true high blood pressure or from the pain you are having in your back, we will plan to follow-up in the next few weeks.  In the interim, I would like you to follow a low-salt diet.  Work on exercise and increasing your fruits and vegetables.   DASH Eating Plan DASH stands for "Dietary Approaches to Stop Hypertension." The DASH eating plan is a healthy eating plan that has been shown to reduce high blood pressure (hypertension). It may also reduce your risk for type 2 diabetes, heart disease, and stroke. The DASH eating plan may also help with weight loss. What are tips for following this plan? General guidelines  Avoid eating more than 2,300 mg (milligrams) of salt (sodium) a day. If you have hypertension, you may need to reduce your sodium intake to 1,500 mg a day.  Limit alcohol intake to no more than 1 drink a day for nonpregnant women and 2 drinks a day for men. One drink equals 12 oz of beer, 5 oz of wine, or 1 oz of hard liquor.  Work with your health care provider to maintain a healthy body weight or to lose weight. Ask what an ideal weight is for you.  Get at least 30 minutes of exercise that causes your heart to  beat faster (aerobic exercise) most days of the week. Activities may include walking, swimming, or biking.  Work with your health care provider or diet and nutrition specialist (dietitian) to adjust your eating plan to your individual calorie needs. Reading food labels  Check food labels for the amount of sodium per serving. Choose foods with less than 5 percent of the Daily Value of sodium. Generally, foods with less than 300 mg of sodium per serving fit into this eating plan.  To find whole grains, look for the word "whole" as the first word in the ingredient list. Shopping  Buy products labeled as "low-sodium" or "no salt added."  Buy fresh foods. Avoid canned foods and premade or frozen meals. Cooking  Avoid adding salt when cooking. Use salt-free seasonings or herbs instead of table salt or sea salt. Check with your health care provider or pharmacist before using salt substitutes.  Do not fry foods. Cook foods using healthy methods such as baking, boiling, grilling, and broiling instead.  Cook with heart-healthy oils, such as olive, canola, soybean, or sunflower oil. Meal planning   Eat a balanced diet that includes: ? 5 or more servings of fruits and vegetables each day. At each meal, try to fill half of your plate with fruits and vegetables. ? Up to 6-8 servings of whole grains  each day. ? Less than 6 oz of lean meat, poultry, or fish each day. A 3-oz serving of meat is about the same size as a deck of cards. One egg equals 1 oz. ? 2 servings of low-fat dairy each day. ? A serving of nuts, seeds, or beans 5 times each week. ? Heart-healthy fats. Healthy fats called Omega-3 fatty acids are found in foods such as flaxseeds and coldwater fish, like sardines, salmon, and mackerel.  Limit how much you eat of the following: ? Canned or prepackaged foods. ? Food that is high in trans fat, such as fried foods. ? Food that is high in saturated fat, such as fatty meat. ? Sweets,  desserts, sugary drinks, and other foods with added sugar. ? Full-fat dairy products.  Do not salt foods before eating.  Try to eat at least 2 vegetarian meals each week.  Eat more home-cooked food and less restaurant, buffet, and fast food.  When eating at a restaurant, ask that your food be prepared with less salt or no salt, if possible. What foods are recommended? The items listed may not be a complete list. Talk with your dietitian about what dietary choices are best for you. Grains Whole-grain or whole-wheat bread. Whole-grain or whole-wheat pasta. Brown rice. [place]. Bulgur. Whole-grain and low-sodium cereals. Pita bread. Low-fat, low-sodium crackers. Whole-wheat flour tortillas. Vegetables Fresh or frozen vegetables (raw, steamed, roasted, or grilled). Low-sodium or reduced-sodium tomato and vegetable juice. Low-sodium or reduced-sodium tomato sauce and tomato paste. Low-sodium or reduced-sodium canned vegetables. Fruits All fresh, dried, or frozen fruit. Canned fruit in natural juice (without added sugar). Meat and other protein foods Skinless chicken or [place]. Ground chicken or [place]. Pork with fat trimmed off. Fish and seafood. Egg whites. Dried beans, peas, or lentils. Unsalted nuts, nut butters, and seeds. Unsalted canned beans. Lean cuts of beef with fat trimmed off. Low-sodium, lean deli meat. Dairy Low-fat (1%) or fat-free (skim) milk. Fat-free, low-fat, or reduced-fat cheeses. Nonfat, low-sodium ricotta or cottage cheese. Low-fat or nonfat yogurt. Low-fat, low-sodium cheese. Fats and oils Soft margarine without trans fats. Vegetable oil. Low-fat, reduced-fat, or light mayonnaise and salad dressings (reduced-sodium). Canola, safflower, olive, soybean, and sunflower oils. Avocado. Seasoning and other foods Herbs. Spices. Seasoning mixes without salt. Unsalted popcorn and pretzels. Fat-free sweets. What foods are not recommended? The items listed may not be a  complete list. Talk with your dietitian about what dietary choices are best for you. Grains Baked goods made with fat, such as croissants, muffins, or some breads. Dry pasta or rice meal packs. Vegetables Creamed or fried vegetables. Vegetables in a cheese sauce. Regular canned vegetables (not low-sodium or reduced-sodium). Regular canned tomato sauce and paste (not low-sodium or reduced-sodium). Regular tomato and vegetable juice (not low-sodium or reduced-sodium). Angie Fava. Olives. Fruits Canned fruit in a light or heavy syrup. Fried fruit. Fruit in cream or butter sauce. Meat and other protein foods Fatty cuts of meat. Ribs. Fried meat. Berniece Salines. Sausage. Bologna and other processed lunch meats. Salami. Fatback. Hotdogs. Bratwurst. Salted nuts and seeds. Canned beans with added salt. Canned or smoked fish. Whole eggs or egg yolks. Chicken or [place] with skin. Dairy Whole or 2% milk, cream, and half-and-half. Whole or full-fat cream cheese. Whole-fat or sweetened yogurt. Full-fat cheese. Nondairy creamers. Whipped toppings. Processed cheese and cheese spreads. Fats and oils Butter. Stick margarine. Lard. Shortening. Ghee. Bacon fat. Tropical oils, such as coconut, palm kernel, or palm oil. Seasoning and other foods Salted popcorn and  pretzels. Onion salt, garlic salt, seasoned salt, table salt, and sea salt. Worcestershire sauce. Tartar sauce. Barbecue sauce. Teriyaki sauce. Soy sauce, including reduced-sodium. Steak sauce. Canned and packaged gravies. Fish sauce. Oyster sauce. Cocktail sauce. Horseradish that you find on the shelf. Ketchup. Mustard. Meat flavorings and tenderizers. Bouillon cubes. Hot sauce and Tabasco sauce. Premade or packaged marinades. Premade or packaged taco seasonings. Relishes. Regular salad dressings. Where to find more information:  National Heart, Lung, and Blood Institute: PopSteam.iswww.nhlbi.nih.gov  American Heart Association: www.heart.org Summary  The DASH eating plan is a  healthy eating plan that has been shown to reduce high blood pressure (hypertension). It may also reduce your risk for type 2 diabetes, heart disease, and stroke.  With the DASH eating plan, you should limit salt (sodium) intake to 2,300 mg a day. If you have hypertension, you may need to reduce your sodium intake to 1,500 mg a day.  When on the DASH eating plan, aim to eat more fresh fruits and vegetables, whole grains, lean proteins, low-fat dairy, and heart-healthy fats.  Work with your health care provider or diet and nutrition specialist (dietitian) to adjust your eating plan to your individual calorie needs. This information is not intended to replace advice given to you by your health care provider. Make sure you discuss any questions you have with your health care provider. Document Released: 08/30/2011 Document Revised: 09/03/2016 Document Reviewed: 09/03/2016 Elsevier Interactive Patient Education  2017 Elsevier Inc.  Insomnia Insomnia is a sleep disorder that makes it difficult to fall asleep or to stay asleep. Insomnia can cause tiredness (fatigue), low energy, difficulty concentrating, mood swings, and poor performance at work or school. There are three different ways to classify insomnia:  Difficulty falling asleep.  Difficulty staying asleep.  Waking up too early in the morning.  Any type of insomnia can be long-term (chronic) or short-term (acute). Both are common. Short-term insomnia usually lasts for three months or less. Chronic insomnia occurs at least three times a week for longer than three months. What are the causes? Insomnia may be caused by another condition, situation, or substance, such as:  Anxiety.  Certain medicines.  Gastroesophageal reflux disease (GERD) or other gastrointestinal conditions.  Asthma or other breathing conditions.  Restless legs syndrome, sleep apnea, or other sleep disorders.  Chronic pain.  Menopause. This may include hot  flashes.  Stroke.  Abuse of alcohol, tobacco, or illegal drugs.  Depression.  Caffeine.  Neurological disorders, such as Alzheimer disease.  An overactive thyroid (hyperthyroidism).  The cause of insomnia may not be known. What increases the risk? Risk factors for insomnia include:  Gender. Women are more commonly affected than men.  Age. Insomnia is more common as you get older.  Stress. This may involve your professional or personal life.  Income. Insomnia is more common in people with lower income.  Lack of exercise.  Irregular work schedule or night shifts.  Traveling between different time zones.  What are the signs or symptoms? If you have insomnia, trouble falling asleep or trouble staying asleep is the main symptom. This may lead to other symptoms, such as:  Feeling fatigued.  Feeling nervous about going to sleep.  Not feeling rested in the morning.  Having trouble concentrating.  Feeling irritable, anxious, or depressed.  How is this treated? Treatment for insomnia depends on the cause. If your insomnia is caused by an underlying condition, treatment will focus on addressing the condition. Treatment may also include:  Medicines to help you  sleep.  Counseling or therapy.  Lifestyle adjustments.  Follow these instructions at home:  Take medicines only as directed by your health care provider.  Keep regular sleeping and waking hours. Avoid naps.  Keep a sleep diary to help you and your health care provider figure out what could be causing your insomnia. Include: ? When you sleep. ? When you wake up during the night. ? How well you sleep. ? How rested you feel the next day. ? Any side effects of medicines you are taking. ? What you eat and drink.  Make your bedroom a comfortable place where it is easy to fall asleep: ? Put up shades or special blackout curtains to block light from outside. ? Use a white noise machine to block noise. ? Keep  the temperature cool.  Exercise regularly as directed by your health care provider. Avoid exercising right before bedtime.  Use relaxation techniques to manage stress. Ask your health care provider to suggest some techniques that may work well for you. These may include: ? Breathing exercises. ? Routines to release muscle tension. ? Visualizing peaceful scenes.  Cut back on alcohol, caffeinated beverages, and cigarettes, especially close to bedtime. These can disrupt your sleep.  Do not overeat or eat spicy foods right before bedtime. This can lead to digestive discomfort that can make it hard for you to sleep.  Limit screen use before bedtime. This includes: ? Watching TV. ? Using your smartphone, tablet, and computer.  Stick to a routine. This can help you fall asleep faster. Try to do a quiet activity, brush your teeth, and go to bed at the same time each night.  Get out of bed if you are still awake after 15 minutes of trying to sleep. Keep the lights down, but try reading or doing a quiet activity. When you feel sleepy, go back to bed.  Make sure that you drive carefully. Avoid driving if you feel very sleepy.  Keep all follow-up appointments as directed by your health care provider. This is important. Contact a health care provider if:  You are tired throughout the day or have trouble in your daily routine due to sleepiness.  You continue to have sleep problems or your sleep problems get worse. Get help right away if:  You have serious thoughts about hurting yourself or someone else. This information is not intended to replace advice given to you by your health care provider. Make sure you discuss any questions you have with your health care provider. Document Released: 09/07/2000 Document Revised: 02/10/2016 Document Reviewed: 06/11/2014 Elsevier Interactive Patient Education  [employer]2018 Elsevier Inc.

## 2017-08-02 NOTE — Progress Notes (Signed)
Subjective: CC: back pain HPI: Vincent [name] is a 31 y.o. male presenting to clinic today for:  1. Back pain Patient was seen in September for same.  He was diagnosed with acute left-sided low back pain with sciatica.  He was prescribed Zanaflex, Voltaren and a 10 pound lifting restriction at work.  Patient reports great improvement with medications but seems to reinjure himself when he is doing work that requires extensive physical exertion.  He works as a [employer] and frequently has to lift, push, pull and bend in order to perform his duties.  He is never seen a physical therapist but is interested in this.  He was considering a Landchiropractor as well.  He denies any real change in the pain.  He still describes it as left-sided low back pain that radiates to his left buttock.  No weakness, falls, numbness or tingling in the groin or lower extremities.  No fecal incontinence or urinary retention.  2. Sleep difficulty Patient reports difficulty with sleep as well.  He notes that he was previously prescribed benzodiazepines for this but does not wish to take these types of medications because they cause excessive sedation.  He thinks he may have been on trazodone in the past but was unsure if it worked for him well.  He reports being on multiple psychiatric medications in the past which she does not require anymore for depression.  Denies depressive symptoms or anxiety.  He reports that he goes to bed usually around 915 each night and gets up at 5:00 each morning.  He notes that around 1:00 he will be wide awake in bed still trying to get to sleep.  He has been avoiding blue lights, television in bed, excessive consumption of fluids before bedtime.  He has worked on sleep hygiene extensively with little improvement in symptoms.  3.  Elevated blood pressure reading Patient reports that he is somewhat concerned about elevated blood pressures.  He does report excessive intake of salt.  He does eat  frozen food, fast foods, canned goods and prepackaged foods.  He also drinks several sodas daily.  He reports frequent salting of his food.  He does have a family history significant for cardiovascular disease and myocardial infarction in his father at age 32.  Several family members with history of hypertension and cardiovascular disease.  Patient is a former smoker.  Physical exercise is not structured.  He is active at work.  He would like to avoid medications if possible and is asking for information regarding diet and lifestyle modification.  He is amenable to going on medication if needed.  Denies chest pain, shortness of breath, dizziness, visual disturbance, headache, lower extremity swelling, abdominal pain, nausea, vomiting.   Social Hx: former smoker.  Family History  Family history unknown: Yes   Past Medical History:  Diagnosis Date  . ADHD (attention deficit hyperactivity disorder)   . Anxiety   . Asthma    Allergies  Allergen Reactions  . Tigan [Trimethobenzamide Hcl]     Current Outpatient Medications:  .  albuterol (PROVENTIL HFA;VENTOLIN HFA) 108 (90 BASE) MCG/ACT inhaler, Inhale 2 puffs into the lungs every 6 (six) hours as needed for wheezing., Disp: , Rfl:  .  diclofenac (VOLTAREN) 75 MG EC tablet, Take 1 tablet (75 mg total) 2 (two) times daily by mouth. As needed for back pain, Disp: 30 tablet, Rfl: 0 .  tiZANidine (ZANAFLEX) 4 MG capsule, Take 1 capsule (4 mg total) 3 (three) times daily  by mouth., Disp: 30 capsule, Rfl: 0 .  traZODone (DESYREL) 50 MG tablet, Take 0.5-1 tablets (25-50 mg total) at bedtime as needed by mouth for sleep., Disp: 30 tablet, Rfl: 0  Health Maintenance: Flu shot  ROS: Per HPI  Objective: Office vital signs reviewed. BP (!) 154/98   Pulse 98   Temp 98.4 F (36.9 C) (Oral)   Ht 5\' 9"  (1.753 m)   Wt 222 lb (100.7 kg)   BMI 32.78 kg/m  Repeat Blood pressure (!) 154/98  Physical Examination:  General: Awake, alert, overweight, No  acute distress HEENT: Normal    Neck: No masses palpated. No lymphadenopathy    Eyes: PERRLA, extraocular movement in tact, sclera white    Nose: nasal turbinates moist, no nasal discharge    Throat: moist mucus membranes Cardio: regular rate and rhythm, S1S2 heard, no murmurs appreciated Pulm: clear to auscultation bilaterally, no wheezes, rhonchi or rales; normal work of breathing on room air Extremities: warm, well perfused, No edema, cyanosis or clubbing; +2 pulses bilaterally MSK: normal gait and normal station  L- Spine: Patient has full active range of motion.  He does have discomfort with rotation to the left.  No midline tenderness to palpation.  He does have tenderness to palpation bilaterally with predominance on the left at the lumbosacral junction and into the left gluteal region.  He has tightness and slight discomfort with straight leg raise bilaterally. Neuro: 5/5 LE Strength and light touch sensation grossly intact, patellar DTRs 2/4  Assessment/ Plan: 31 y.o. male   Chronic left-sided low back pain with left-sided sciatica Would consider this now a chronic issue.  He is still within the 6058-month window of onset.  I did discuss consideration for MRI of his back and referral to orthopedics.  He is not interested in surgery or invasive procedures at this time but is willing to see a physical therapist for rehab.  I [place].  We did discuss consideration for chiropractic medicine.  I did review with him that he may consider seeing physical therapy prior to pursuing chiropractic medicine.  Refills provided for the Voltaren and the Zanaflex.  Weight loss is encouraged.  He will follow-up in 1 month.  Elevated blood pressure reading Still elevated on recheck.  Repeat blood pressure was 154/98.  He has a significant family history for cardiovascular disease including early MI in his father.  It is difficult to discern whether or not the  blood pressure is related to his back pain.  However, he did report to me a 30+ pound weight gain in the last couple of years.  He also consumes quite a bit of salt on a regular basis.  DASH diet was reviewed with the patient and a handout was provided to him today.  He will follow-up in 4 weeks.  If persistently elevated, we will plan to initiate antihypertensive medications, likely HCTZ 25 mg daily.  No red flags.  Follow-up in 4 weeks.  Sleep difficulties It sounds like he is worked on with sleep hygiene quite a bit.  I do agree that benzodiazepines are not a great option for long-term treatment of insomnia.  I did recommend reduction of soda.  Handout was given for continued sleep hygiene improvement.  I did offer Belsomra versus trazodone.  Because patient has tried trazodone in the past he would like to revisit this medication.  I prescribed this at 50 mg nightly.  He may cut this  in half to 25 mg if he experiences excessive sedation in the morning.  He will follow-up in 4 weeks.   Raliegh Ip, DO [facility] [phone]

## 2017-08-02 NOTE — Assessment & Plan Note (Signed)
Still elevated on recheck.  Repeat blood pressure was 154/98.  He has a significant family history for cardiovascular disease including early MI in his father.  It is difficult to discern whether or not the blood pressure is related to his back pain.  However, he did report to me a 30+ pound weight gain in the last couple of years.  He also consumes quite a bit of salt on a regular basis.  DASH diet was reviewed with the patient and a handout was provided to him today.  He will follow-up in 4 weeks.  If persistently elevated, we will plan to initiate antihypertensive medications, likely HCTZ 25 mg daily.  No red flags.  Follow-up in 4 weeks.

## 2017-08-06 ENCOUNTER — Telehealth: Payer: Self-pay | Admitting: Family Medicine

## 2017-08-06 NOTE — Telephone Encounter (Signed)
Pt is aware CD has been placed up front for p/u

## 2017-08-26 ENCOUNTER — Telehealth: Payer: Self-pay | Admitting: Family Medicine

## 2017-08-26 NOTE — Telephone Encounter (Signed)
Pt states traZODone (DESYREL) 50 MG tablet is not working wants something else called into [facility] [place]. He is aware he has appt for follow up on Friday and wanted something called in sooner. Please advise

## 2017-08-26 NOTE — Telephone Encounter (Signed)
Please advise 

## 2017-08-26 NOTE — Telephone Encounter (Signed)
Left detailed message on patients voicemail to increase dose and to keep follow up appt on Friday

## 2017-08-26 NOTE — Telephone Encounter (Signed)
He may increase the trazodone to 2 tablets at bedtime (100mg ).  Please have him keep his appt Friday; if this is not helpful we can revisit Belsomra as an alternative.

## 2017-08-27 ENCOUNTER — Other Ambulatory Visit: Payer: Self-pay | Admitting: Family Medicine

## 2017-08-27 ENCOUNTER — Telehealth: Payer: Self-pay | Admitting: Family Medicine

## 2017-08-27 MED ORDER — TRAZODONE HCL 100 MG PO TABS: 100.0000 mg | ORAL_TABLET | Freq: Every evening | ORAL | 0 refills | Status: DC | PRN

## 2017-08-27 NOTE — Telephone Encounter (Signed)
Rx w/ new dose sent

## 2017-08-27 NOTE — Telephone Encounter (Signed)
Pt requesting refill on Trazodone Please advise

## 2017-08-27 NOTE — Telephone Encounter (Signed)
Left detailed message for pt regarding RX 

## 2017-08-28 NOTE — Progress Notes (Deleted)
Subjective: CC: insomnia HPI: Vincent [name] is a 31 y.o. male presenting to clinic today for:  1. Insomnia Patient seen 1 month ago for insomnia.  He was started on trazodone 25-50 mg at bedtime.  About 1 week ago, he called noting that trazodone was not substantially helpful with sleep.  This was increased to 100 mg at bedtime.  He notes ***.  2.  Chronic low back pain Patient was seen 1 month ago for chronic low back pain.  Voltaren and tizanidine were prescribed.  He is actually referred to physical therapy but he instead decided to go to chiropractic medicine.  He notes ***  3.  Elevated blood pressure Patient was noted to have an elevated blood pressure with systolics in the 150s at last office visit.  Lifestyle modifications were recommended.  Weight loss was recommended. ***Chest pain, shortness of breath, nausea, vomiting, dizziness, lower extremity edema, headache.   ROS: Per HPI  Past Medical History:  Diagnosis Date  . ADHD (attention deficit hyperactivity disorder)   . Anxiety   . Asthma    Allergies  Allergen Reactions  . Tigan [Trimethobenzamide Hcl]     Current Outpatient Medications:  .  albuterol (PROVENTIL HFA;VENTOLIN HFA) 108 (90 BASE) MCG/ACT inhaler, Inhale 2 puffs into the lungs every 6 (six) hours as needed for wheezing., Disp: , Rfl:  .  diclofenac (VOLTAREN) 75 MG EC tablet, Take 1 tablet (75 mg total) 2 (two) times daily by mouth. As needed for back pain, Disp: 30 tablet, Rfl: 0 .  tiZANidine (ZANAFLEX) 4 MG capsule, Take 1 capsule (4 mg total) 3 (three) times daily by mouth., Disp: 30 capsule, Rfl: 0 .  traZODone (DESYREL) 100 MG tablet, Take 1 tablet (100 mg total) by mouth at bedtime as needed for sleep., Disp: 30 tablet, Rfl: 0 Social History   Socioeconomic History  . Marital status: Married    Spouse name: Not on file  . Number of children: Not on file  . Years of education: Not on file  . Highest education level: Not on file  Social  Needs  . Financial resource strain: Not on file  . Food insecurity - worry: Not on file  . Food insecurity - inability: Not on file  . Transportation needs - medical: Not on file  . Transportation needs - non-medical: Not on file  Occupational History  . Not on file  Tobacco Use  . Smoking status: Former Smoker    Packs/day: 0.50  . Smokeless tobacco: Never Used  Substance and Sexual Activity  . Alcohol use: No  . Drug use: No  . Sexual activity: Yes    Birth control/protection: Surgical  Other Topics Concern  . 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 There were no vitals taken for this visit.  Physical Examination:  General: Awake, alert, *** nourished, No acute distress HEENT: Normal    Neck: No masses palpated. No lymphadenopathy    Ears: Tympanic membranes intact, normal light reflex, no erythema, no bulging    Eyes: PERRLA, extraocular movement in tact, sclera ***    Nose: nasal turbinates moist, *** nasal discharge    Throat: moist mucus membranes, no erythema, *** tonsillar exudate.  Airway is patent Cardio: regular rate and rhythm, S1S2 heard, no murmurs appreciated Pulm: clear to auscultation  bilaterally, no wheezes, rhonchi or rales; normal work of breathing on room air GI: soft, non-tender, non-distended, bowel sounds present x4, no hepatomegaly, no splenomegaly, no masses GU: external vaginal tissue ***, cervix ***, *** punctate lesions on cervix appreciated, *** discharge from cervical os, *** bleeding, *** cervical motion tenderness, *** abdominal/ adnexal masses Extremities: warm, well perfused, No edema, cyanosis or clubbing; +*** pulses bilaterally MSK: *** gait and *** station Skin: dry; intact; no rashes or lesions Neuro: *** Strength and light touch sensation grossly intact, *** DTRs  ***/4  Assessment/ Plan: 31 y.o. male   No problem-specific Assessment & Plan notes found for this encounter.   Raliegh IpAshly M Kieryn Burtis, DO [facility] [phone]

## 2017-08-29 ENCOUNTER — Other Ambulatory Visit: Payer: Self-pay | Admitting: Family Medicine

## 2017-08-30 ENCOUNTER — Ambulatory Visit: Payer: BLUE CROSS/BLUE SHIELD | Admitting: Family Medicine

## 2017-09-07 ENCOUNTER — Encounter: Payer: Self-pay | Admitting: Family Medicine

## 2017-09-18 ENCOUNTER — Other Ambulatory Visit: Payer: Self-pay | Admitting: Family Medicine

## 2017-09-18 DIAGNOSIS — M62830 Muscle spasm of back: Secondary | ICD-10-CM

## 2017-09-18 DIAGNOSIS — M545 Low back pain: Secondary | ICD-10-CM

## 2018-01-06 ENCOUNTER — Encounter: Payer: Self-pay | Admitting: Physician Assistant

## 2018-01-06 ENCOUNTER — Ambulatory Visit: Payer: BLUE CROSS/BLUE SHIELD | Admitting: Physician Assistant

## 2018-01-06 VITALS — BP 144/88 | HR 97 | Temp 97.9°F | Ht 69.0 in | Wt 214.0 lb

## 2018-01-06 DIAGNOSIS — M62830 Muscle spasm of back: Secondary | ICD-10-CM | POA: Diagnosis not present

## 2018-01-06 DIAGNOSIS — G479 Sleep disorder, unspecified: Secondary | ICD-10-CM | POA: Diagnosis not present

## 2018-01-06 DIAGNOSIS — M5442 Lumbago with sciatica, left side: Secondary | ICD-10-CM | POA: Diagnosis not present

## 2018-01-06 DIAGNOSIS — G8929 Other chronic pain: Secondary | ICD-10-CM

## 2018-01-06 DIAGNOSIS — M4125 Other idiopathic scoliosis, thoracolumbar region: Secondary | ICD-10-CM | POA: Diagnosis not present

## 2018-01-06 MED ORDER — TIZANIDINE HCL 6 MG PO CAPS: 6.0000 mg | ORAL_CAPSULE | Freq: Three times a day (TID) | ORAL | 2 refills | Status: DC

## 2018-01-06 MED ORDER — TRAZODONE HCL 300 MG PO TABS: 300.0000 mg | ORAL_TABLET | Freq: Every evening | ORAL | 5 refills | Status: DC | PRN

## 2018-01-06 MED ORDER — PREDNISONE 10 MG (48) PO TBPK: ORAL_TABLET | ORAL | 0 refills | Status: DC

## 2018-01-06 MED ORDER — OXYCODONE-ACETAMINOPHEN 10-325 MG PO TABS: 1.0000 | ORAL_TABLET | ORAL | 0 refills | Status: DC | PRN

## 2018-01-06 NOTE — Progress Notes (Signed)
BP (!) 144/88   Pulse 97   Temp 97.9 F (36.6 C) (Oral)   Ht 5\' 9"  (1.753 m)   Wt 214 lb (97.1 kg)   BMI 31.60 kg/m    Subjective:    Patient ID: Vincent [name], male    DOB: Sep 04, 1986, 32 y.o.   MRN: [id number]  HPI: [name] is a 32 y.o. male presenting on 01/06/2018 for Back Pain (low )  This patient has a flareup of his low back pain.  He has some lumbar scoliosis.  He had issues a few years ago and things really did seem to settle down.  He tends to go to the chiropractor on a regular basis.  This seems to keep things at bay.  In recent days he had to do some heavy lifting and his back is gotten really out of whack and tight.  He has tried over-the-counter medications without much relief.  He has upcoming significant travel plans and knows that he needs to get a calm down before he leaves.  Past Medical History:  Diagnosis Date  . ADHD (attention deficit hyperactivity disorder)   . Anxiety   . Asthma    Relevant past medical, surgical, family and social history reviewed and updated as indicated. Interim medical history since our last visit reviewed. Allergies and medications reviewed and updated. DATA REVIEWED: CHART IN EPIC  Family History reviewed for pertinent findings.  Review of Systems  Constitutional: Negative.  Negative for appetite change and fatigue.  Eyes: Negative for pain and visual disturbance.  Respiratory: Negative.  Negative for cough, chest tightness, shortness of breath and wheezing.   Cardiovascular: Negative.  Negative for chest pain, palpitations and leg swelling.  Gastrointestinal: Negative.  Negative for abdominal pain, diarrhea, nausea and vomiting.  Genitourinary: Negative.   Musculoskeletal: Positive for arthralgias, back pain, gait problem and myalgias.  Skin: Negative.  Negative for color change and rash.  Neurological: Negative for weakness, numbness and headaches.  Psychiatric/Behavioral: Negative.     Allergies as of  01/06/2018      Reactions   Tigan [trimethobenzamide Hcl]       Medication List        Accurate as of 01/06/18  6:34 PM. Always use your most recent med list.          albuterol 108 (90 Base) MCG/ACT inhaler Commonly known as:  PROVENTIL HFA;VENTOLIN HFA Inhale 2 puffs into the lungs every 6 (six) hours as needed for wheezing.   oxyCODONE-acetaminophen 10-325 MG tablet Commonly known as:  PERCOCET Take 1 tablet by mouth every 4 (four) hours as needed for pain.   predniSONE 10 MG (48) Tbpk tablet Commonly known as:  STERAPRED UNI-PAK 48 TAB Take as directed for 12 days   tizanidine 6 MG capsule Commonly known as:  ZANAFLEX Take 1 capsule (6 mg total) by mouth 3 (three) times daily.   trazodone 300 MG tablet Commonly known as:  DESYREL Take 1 tablet (300 mg total) by mouth at bedtime as needed for sleep.          Objective:    BP (!) 144/88   Pulse 97   Temp 97.9 F (36.6 C) (Oral)   Ht 5\' 9"  (1.753 m)   Wt 214 lb (97.1 kg)   BMI 31.60 kg/m   Allergies  Allergen Reactions  . Tigan [Trimethobenzamide Hcl]     Wt Readings from Last 3 Encounters:  01/06/18 214 lb (97.1 kg)  08/02/17 222  lb (100.7 kg)  05/29/17 224 lb (101.6 kg)    Physical Exam  Constitutional: He appears well-developed and well-nourished. No distress.  HENT:  Head: Normocephalic and atraumatic.  Eyes: Pupils are equal, round, and reactive to light. Conjunctivae and EOM are normal.  Cardiovascular: Normal rate, regular rhythm and normal heart sounds.  Pulmonary/Chest: Effort normal and breath sounds normal. No respiratory distress.  Musculoskeletal:       Lumbar back: He exhibits decreased range of motion, tenderness, pain and spasm.  Neurological:  Reflex Scores:      Patellar reflexes are 1+ on the right side and 3+ on the left side. Skin: Skin is warm and dry.  Psychiatric: He has a normal mood and affect. His behavior is normal.  Nursing note and vitals reviewed.         Assessment & Plan:   1. Other idiopathic scoliosis, thoracolumbar region - predniSONE (STERAPRED UNI-PAK 48 TAB) 10 MG (48) TBPK tablet; Take as directed for 12 days  Dispense: 48 tablet; Refill: 0 - oxyCODONE-acetaminophen (PERCOCET) 10-325 MG tablet; Take 1 tablet by mouth every 4 (four) hours as needed for pain.  Dispense: 30 tablet; Refill: 0  2. Chronic left-sided low back pain with left-sided sciatica - predniSONE (STERAPRED UNI-PAK 48 TAB) 10 MG (48) TBPK tablet; Take as directed for 12 days  Dispense: 48 tablet; Refill: 0 - oxyCODONE-acetaminophen (PERCOCET) 10-325 MG tablet; Take 1 tablet by mouth every 4 (four) hours as needed for pain.  Dispense: 30 tablet; Refill: 0  3. Muscle spasm of back - tizanidine (ZANAFLEX) 6 MG capsule; Take 1 capsule (6 mg total) by mouth 3 (three) times daily.  Dispense: 90 capsule; Refill: 2 - predniSONE (STERAPRED UNI-PAK 48 TAB) 10 MG (48) TBPK tablet; Take as directed for 12 days  Dispense: 48 tablet; Refill: 0  4. Sleep difficulties - traZODone (DESYREL) 300 MG tablet; Take 1 tablet (300 mg total) by mouth at bedtime as needed for sleep.  Dispense: 30 tablet; Refill: 5   Continue all other maintenance medications as listed above.  Follow up plan: Follow-up as needed or worsening of symptoms. Call office for any issues.  Educational handout given for survey  Remus LofflerAngel S. Deneene Tarver PA-C [facility] [address]  [place], [place] [id number] [phone]   01/06/2018, 6:34 PM

## 2018-01-07 ENCOUNTER — Telehealth: Payer: Self-pay | Admitting: Physician Assistant

## 2018-01-07 NOTE — Telephone Encounter (Signed)
Patient aware [facility] has his Zanaflex.

## 2018-01-22 ENCOUNTER — Ambulatory Visit: Payer: BLUE CROSS/BLUE SHIELD | Admitting: Physician Assistant

## 2018-01-22 ENCOUNTER — Encounter: Payer: Self-pay | Admitting: Physician Assistant

## 2018-01-22 VITALS — BP 165/91 | HR 114 | Temp 99.3°F | Ht 69.0 in | Wt 216.6 lb

## 2018-01-22 DIAGNOSIS — M4125 Other idiopathic scoliosis, thoracolumbar region: Secondary | ICD-10-CM | POA: Diagnosis not present

## 2018-01-22 DIAGNOSIS — M5442 Lumbago with sciatica, left side: Secondary | ICD-10-CM | POA: Diagnosis not present

## 2018-01-22 DIAGNOSIS — G8929 Other chronic pain: Secondary | ICD-10-CM | POA: Diagnosis not present

## 2018-01-22 DIAGNOSIS — I10 Essential (primary) hypertension: Secondary | ICD-10-CM

## 2018-01-22 MED ORDER — PREDNISONE 10 MG PO TABS: 10.0000 mg | ORAL_TABLET | Freq: Two times a day (BID) | ORAL | 0 refills | Status: DC

## 2018-01-22 MED ORDER — OXYCODONE-ACETAMINOPHEN 10-325 MG PO TABS: 2.0000 | ORAL_TABLET | ORAL | 0 refills | Status: DC | PRN

## 2018-01-24 ENCOUNTER — Ambulatory Visit: Payer: BLUE CROSS/BLUE SHIELD | Admitting: Physician Assistant

## 2018-01-24 NOTE — Progress Notes (Signed)
This patient comes in for recheck on his back pain.  Is been going on for over a year.  He has been to his chiropractor and usually this will calm things down but has not been helping lately.  Actually is hurting more critically hurting over the SI joint and he does have radiation down his leg.  He is not falling or having any weakness.  His sleep is greatly improved with the trazodone and he wants to continue this medication.    BP (!) 165/91   Pulse (!) 114   Temp 99.3 F (37.4 C) (Oral)   Ht '5\' 9"'$  (1.753 m)   Wt 216 lb 9.6 oz (98.2 kg)   BMI 31.99 kg/m    Subjective:    Patient ID: Karen Kays, male    DOB: 1985/11/24, 32 y.o.   MRN: [id number]  HPI: NED KAKAR is a 32 y.o. male presenting on 01/22/2018 for Back Pain    Past Medical History:  Diagnosis Date  . ADHD (attention deficit hyperactivity disorder)   . Anxiety   . Asthma    Relevant past medical, surgical, family and social history reviewed and updated as indicated. Interim medical history since our last visit reviewed. Allergies and medications reviewed and updated. DATA REVIEWED: CHART IN EPIC  Family History reviewed for pertinent findings.  Review of Systems  Constitutional: Negative.  Negative for appetite change and fatigue.  Eyes: Negative for pain and visual disturbance.  Respiratory: Negative.  Negative for cough, chest tightness, shortness of breath and wheezing.   Cardiovascular: Negative.  Negative for chest pain, palpitations and leg swelling.  Gastrointestinal: Negative.  Negative for abdominal pain, diarrhea, nausea and vomiting.  Genitourinary: Negative.   Musculoskeletal: Positive for arthralgias, back pain and myalgias.  Skin: Negative.  Negative for color change and rash.  Neurological: Negative.  Negative for weakness, numbness and headaches.  Psychiatric/Behavioral: Negative.     Allergies as of 01/22/2018      Reactions   Tigan [trimethobenzamide Hcl]       Medication List        Accurate as of 01/22/18 11:59 PM. Always use your most recent med list.          albuterol 108 (90 Base) MCG/ACT inhaler Commonly known as:  PROVENTIL HFA;VENTOLIN HFA Inhale 2 puffs into the lungs every 6 (six) hours as needed for wheezing.   oxyCODONE-acetaminophen 10-325 MG tablet Commonly known as:  PERCOCET Take 2 tablets by mouth every 4 (four) hours as needed for pain.   predniSONE 10 MG tablet Commonly known as:  DELTASONE Take 1 tablet (10 mg total) by mouth 2 (two) times daily with a meal.   tizanidine 6 MG capsule Commonly known as:  ZANAFLEX Take 1 capsule (6 mg total) by mouth 3 (three) times daily.   trazodone 300 MG tablet Commonly known as:  DESYREL Take 1 tablet (300 mg total) by mouth at bedtime as needed for sleep.          Objective:    BP (!) 165/91   Pulse (!) 114   Temp 99.3 F (37.4 C) (Oral)   Ht '5\' 9"'$  (1.753 m)   Wt 216 lb 9.6 oz (98.2 kg)   BMI 31.99 kg/m   Allergies  Allergen Reactions  . Tigan [Trimethobenzamide Hcl]     Wt Readings from Last 3 Encounters:  01/22/18 216 lb 9.6 oz (98.2 kg)  01/06/18 214 lb (97.1 kg)  08/02/17 222 lb (100.7 kg)  Physical Exam  Constitutional: He appears well-developed and well-nourished. No distress.  HENT:  Head: Normocephalic and atraumatic.  Eyes: Pupils are equal, round, and reactive to light. Conjunctivae and EOM are normal.  Cardiovascular: Normal rate, regular rhythm and normal heart sounds.  Pulmonary/Chest: Effort normal and breath sounds normal. No respiratory distress.  Musculoskeletal:       Lumbar back: He exhibits decreased range of motion, tenderness, pain and spasm.       Back:  Neurological: He exhibits abnormal muscle tone.  Reflex Scores:      Patellar reflexes are 1+ on the right side and 2+ on the left side. Skin: Skin is warm and dry.  Psychiatric: He has a normal mood and affect. His behavior is normal.  Nursing note and vitals reviewed.   Results for orders  placed or performed in visit on 09/07/13  CMP14+EGFR  Result Value Ref Range   Glucose 93 65 - 99 mg/dL   BUN 6 6 - 20 mg/dL   Creatinine, Ser 1.05 0.76 - 1.27 mg/dL   GFR calc non Af Amer 96 >59 mL/min/1.73   GFR calc Af Amer 111 >59 mL/min/1.73   BUN/Creatinine Ratio 6 (L) 8 - 19   Sodium 144 134 - 144 mmol/L   Potassium 3.4 (L) 3.5 - 5.2 mmol/L   Chloride 103 97 - 108 mmol/L   CO2 22 18 - 29 mmol/L   Calcium 9.2 8.7 - 10.2 mg/dL   Total Protein 6.6 6.0 - 8.5 g/dL   Albumin 4.5 3.5 - 5.5 g/dL   Globulin, Total 2.1 1.5 - 4.5 g/dL   Albumin/Globulin Ratio 2.1 1.1 - 2.5   Total Bilirubin 0.4 0.0 - 1.2 mg/dL   Alkaline Phosphatase 67 39 - 117 IU/L   AST 19 0 - 40 IU/L   ALT 20 0 - 44 IU/L  Lipid panel  Result Value Ref Range   Cholesterol, Total 182 100 - 199 mg/dL   Triglycerides 72 0 - 149 mg/dL   HDL 54 >39 mg/dL   VLDL Cholesterol Cal 14 5 - 40 mg/dL   LDL Calculated 114 (H) 0 - 99 mg/dL   Chol/HDL Ratio 3.4 0.0 - 5.0 ratio units  Thyroid Panel With TSH  Result Value Ref Range   TSH 2.750 0.450 - 4.500 uIU/mL   T4, Total 6.1 4.5 - 12.0 ug/dL   T3 Uptake Ratio 32 24 - 39 %   Free Thyroxine Index 2.0 1.2 - 4.9  POCT CBC  Result Value Ref Range   WBC 6.0 4.6 - 10.2 K/uL   Lymph, poc 1.9 0.6 - 3.4   POC LYMPH PERCENT 32.4 10 - 50 %L   MID (cbc)  0 - 0.9   POC MID %  0 - 12 %M   POC Granulocyte 3.6 2 - 6.9   Granulocyte percent 59.7 37 - 80 %G   RBC 5.0 4.69 - 6.13 M/uL   Hemoglobin 14.6 14.1 - 18.1 g/dL   HCT, POC 44.5 43.5 - 53.7 %   MCV 88.6 80 - 97 fL   MCH, POC 29.2 27 - 31.2 pg   MCHC 32.9 31.8 - 35.4 g/dL   RDW, POC 12.8 %   Platelet Count, POC 340.0 142 - 424 K/uL   MPV 6.6 0 - 99.8 fL      Assessment & Plan:   1. Essential hypertension  2. Chronic left-sided low back pain with left-sided sciatica - MR Lumbar Spine Wo Contrast; Future - oxyCODONE-acetaminophen (PERCOCET)  10-325 MG tablet; Take 2 tablets by mouth every 4 (four) hours as needed for  pain.  Dispense: 40 tablet; Refill: 0  3. Other idiopathic scoliosis, thoracolumbar region - MR Lumbar Spine Wo Contrast; Future - oxyCODONE-acetaminophen (PERCOCET) 10-325 MG tablet; Take 2 tablets by mouth every 4 (four) hours as needed for pain.  Dispense: 40 tablet; Refill: 0   Continue all other maintenance medications as listed above.  Follow up plan: Return in about 1 month (around 02/19/2018).  Educational handout given for [place] PA-C [facility] [address]  [place], [place] [address] [phone]   01/24/2018, 9:13 AM

## 2018-01-28 ENCOUNTER — Other Ambulatory Visit: Payer: Self-pay | Admitting: Physician Assistant

## 2018-01-28 DIAGNOSIS — G479 Sleep disorder, unspecified: Secondary | ICD-10-CM

## 2018-01-28 NOTE — Telephone Encounter (Signed)
This was prescribed by Lawanna Kobus, who saw patient for this issue.  I will cc her.

## 2018-01-30 ENCOUNTER — Ambulatory Visit (HOSPITAL_COMMUNITY): Payer: Self-pay

## 2018-02-24 ENCOUNTER — Ambulatory Visit: Payer: BLUE CROSS/BLUE SHIELD | Admitting: Family Medicine

## 2018-02-24 ENCOUNTER — Encounter: Payer: Self-pay | Admitting: Family Medicine

## 2018-02-24 VITALS — BP 152/83 | HR 111 | Temp 98.2°F | Ht 69.0 in | Wt 217.0 lb

## 2018-02-24 DIAGNOSIS — B9562 Methicillin resistant Staphylococcus aureus infection as the cause of diseases classified elsewhere: Secondary | ICD-10-CM | POA: Diagnosis not present

## 2018-02-24 DIAGNOSIS — L0390 Cellulitis, unspecified: Secondary | ICD-10-CM | POA: Diagnosis not present

## 2018-02-24 MED ORDER — CLINDAMYCIN HCL 300 MG PO CAPS: 300.0000 mg | ORAL_CAPSULE | Freq: Four times a day (QID) | ORAL | 0 refills | Status: AC

## 2018-02-24 MED ORDER — DOXYCYCLINE HYCLATE 100 MG PO TABS: 100.0000 mg | ORAL_TABLET | Freq: Two times a day (BID) | ORAL | 0 refills | Status: AC

## 2018-02-24 NOTE — Progress Notes (Signed)
Subjective: CC: MRSA infection PCP: Raliegh Ip, DO ZOX:[id number] Vincent [name] is a 32 y.o. male presenting to clinic today for:  1. MRSA Patient was seen by urgent care on 02/16/2018 for lesions on bilateral knees. Lesions were cultured and did test positive for MRSA.  He was started on doxycycline, Bactroban ointment.  Patient reports that lesions have gotten somewhat better but are still persistent.  He actually just finished medication, citing that he was on 7 days worth of doxycycline.  He denies any fevers since initiation of medication but states that he continues to have purulent discharge from bilateral knees.  He cites that he was actually treated for several of these lesions over this last several years.  He is in fact had to require both clindamycin and doxycycline at the same time before infections in the past.  He reports that he was hospitalized previously and had lesions on his groin requiring IV antibiotics.  He does report some loose stools over the last few days while on doxycycline.  Denies any hematochezia, melena, nausea or vomiting.  He has been taking ibuprofen and Tylenol for discomfort associated with the infection.   ROS: Per HPI  Allergies  Allergen Reactions  . Tigan [Trimethobenzamide Hcl]    Past Medical History:  Diagnosis Date  . ADHD (attention deficit hyperactivity disorder)   . Anxiety   . Asthma     Current Outpatient Medications:  .  albuterol (PROVENTIL HFA;VENTOLIN HFA) 108 (90 BASE) MCG/ACT inhaler, Inhale 2 puffs into the lungs every 6 (six) hours as needed for wheezing., Disp: , Rfl:  .  oxyCODONE-acetaminophen (PERCOCET) 10-325 MG tablet, Take 2 tablets by mouth every 4 (four) hours as needed for pain., Disp: 40 tablet, Rfl: 0 .  predniSONE (DELTASONE) 10 MG tablet, Take 1 tablet (10 mg total) by mouth 2 (two) times daily with a meal., Disp: 60 tablet, Rfl: 0 .  tizanidine (ZANAFLEX) 6 MG capsule, Take 1 capsule (6 mg total) by mouth 3  (three) times daily., Disp: 90 capsule, Rfl: 2 .  trazodone (DESYREL) 300 MG tablet, TAKE 1 TABLET (300 MG TOTAL) BY MOUTH AT BEDTIME AS NEEDED FOR SLEEP., Disp: 90 tablet, Rfl: 1 Social History   Socioeconomic History  . Marital status: Married    Spouse name: Not on file  . Number of children: Not on file  . Years of education: Not on file  . Highest education level: Not on file  Occupational History  . Not on file  Social Needs  . Financial resource strain: Not on file  . Food insecurity:    Worry: Not on file    Inability: Not on file  . Transportation needs:    Medical: Not on file    Non-medical: Not on file  Tobacco Use  . Smoking status: Former Smoker    Packs/day: 0.50  . Smokeless tobacco: Never Used  Substance and Sexual Activity  . Alcohol use: No  . Drug use: No  . Sexual activity: Yes    Birth control/protection: Surgical  Lifestyle  . Physical activity:    Days per week: Not on file    Minutes per session: Not on file  . Stress: Not on file  Relationships  . Social connections:    Talks on phone: Not on file    Gets together: Not on file    Attends religious service: Not on file    Active member of club or organization: Not on file    Attends  meetings of clubs or organizations: Not on file    Relationship status: Not on file  . Intimate partner violence:    Fear of current or ex partner: Not on file    Emotionally abused: Not on file    Physically abused: Not on file    Forced sexual activity: Not on file  Other Topics Concern  . 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 BP (!) 152/83   Pulse (!) 111   Temp 98.2 F (36.8 C) (Oral)   Ht 5\' 9"  (1.753 m)   Wt 217 lb (98.4 kg)   BMI 32.05 kg/m   Physical Examination:  General: Awake, alert, well nourished, well appearing, No acute distress HEENT: Normal    Eyes:  PERRLA, extraocular membranes intact, sclera white Pulm: normal work of breathing on room air Extremities: warm, well perfused, No edema, cyanosis or clubbing; +2 pulses bilaterally MSK: normal gait and normal station  Knees: Patient has full active range of motion of bilateral knees.  No gross joint effusion.  No tenderness to palpation to the patella, joint line or posterior popliteal fossas. Skin: Quarter size of skin breakdown appreciated on bilateral anteriolateral aspects of the knees.  Left worse than right.  He does have purulent drainage appreciated from bilateral wounds.  There is mild tenderness to palpation and mild surrounding erythema that is localized to the edges of the borders of the lesions.  No palpable fluctuance.  No significant induration. Neuro: Light touch sensation grossly intact.  Assessment/ Plan: 32 y.o. male   1. Cellulitis due to MRSA Patient with culture proven MRSA.  I did read his May 2012 discharge summary, which did state that he was admitted for MRSA infection of the groin.  There is no evidence of joint infection today.  I think that the infection continues to be localized to the skin.  We discussed that using clindamycin and doxycycline is somewhat unusual.  However, I think that his risk of developing severe infection is at higher than average given his history.  I have extended the doxycycline another week and added clindamycin 4 times daily for a week.  I did caution signs and symptoms of C. difficile, particularly with the use of clindamycin.  He is to start a probiotic immediately.  Home care instructions were reviewed.  Reasons for return and emergent evaluation in the emergency department discussed.  Patient was good understanding will follow-up in 1 week for recheck.   Meds ordered this encounter  Medications  . clindamycin (CLEOCIN) 300 MG capsule    Sig: Take 1 capsule (300 mg total) by mouth 4 (four) times daily for 7 days.    Dispense:  28 capsule      Refill:  0  . doxycycline (VIBRA-TABS) 100 MG tablet    Sig: Take 1 tablet (100 mg total) by mouth 2 (two) times daily for 7 days.    Dispense:  14 tablet    Refill:  0     Vincent Prosch Hulen SkainsM Laddie Naeem, DO [facility] [phone]

## 2018-02-24 NOTE — Patient Instructions (Signed)
I prescribed you clindamycin and doxycycline for an additional week.  Plan to follow-up with me in 1 week or sooner if needed.  We discussed worsening symptoms and signs.  If you develop any of these, please seek immediate medical attention the emergency department.   Cellulitis, Adult Cellulitis is a skin infection. The infected area is usually red and tender. This condition occurs most often in the arms and lower legs. The infection can travel to the muscles, blood, and underlying tissue and become serious. It is very important to get treated for this condition. What are the causes? Cellulitis is caused by bacteria. The bacteria enter through a break in the skin, such as a cut, burn, insect bite, open sore, or crack. What increases the risk? This condition is more likely to occur in people who:  Have a weak defense system (immune system).  Have open wounds on the skin such as cuts, burns, bites, and scrapes. Bacteria can enter the body through these open wounds.  Are older.  Have diabetes.  Have a type of long-lasting (chronic) liver disease (cirrhosis) or kidney disease.  Use IV drugs.  What are the signs or symptoms? Symptoms of this condition include:  Redness, streaking, or spotting on the skin.  Swollen area of the skin.  Tenderness or pain when an area of the skin is touched.  Warm skin.  Fever.  Chills.  Blisters.  How is this diagnosed? This condition is diagnosed based on a medical history and physical exam. You may also have tests, including:  Blood tests.  Lab tests.  Imaging tests.  How is this treated? Treatment for this condition may include:  Medicines, such as antibiotic medicines or antihistamines.  Supportive care, such as rest and application of cold or warm cloths (cold or warm compresses) to the skin.  Hospital care, if the condition is severe.  The infection usually gets better within 1-2 days of treatment. Follow these instructions at  home:  Take over-the-counter and prescription medicines only as told by your health care provider.  If you were prescribed an antibiotic medicine, take it as told by your health care provider. Do not stop taking the antibiotic even if you start to feel better.  Drink enough fluid to keep your urine clear or pale yellow.  Do not touch or rub the infected area.  Raise (elevate) the infected area above the level of your heart while you are sitting or lying down.  Apply warm or cold compresses to the affected area as told by your health care provider.  Keep all follow-up visits as told by your health care provider. This is important. These visits let your health care provider make sure a more serious infection is not developing. Contact a health care provider if:  You have a fever.  Your symptoms do not improve within 1-2 days of starting treatment.  Your bone or joint underneath the infected area becomes painful after the skin has healed.  Your infection returns in the same area or another area.  You notice a swollen bump in the infected area.  You develop new symptoms.  You have a general ill feeling (malaise) with muscle aches and pains. Get help right away if:  Your symptoms get worse.  You feel very sleepy.  You develop vomiting or diarrhea that persists.  You notice red streaks coming from the infected area.  Your red area gets larger or turns dark in color. This information is not intended to replace advice given  to you by your health care provider. Make sure you discuss any questions you have with your health care provider. Document Released: 06/20/2005 Document Revised: 01/19/2016 Document Reviewed: 07/20/2015 Elsevier Interactive Patient Education  [employer].

## 2018-03-28 ENCOUNTER — Other Ambulatory Visit: Payer: Self-pay | Admitting: Physician Assistant

## 2018-03-28 DIAGNOSIS — M62830 Muscle spasm of back: Secondary | ICD-10-CM

## 2018-05-06 ENCOUNTER — Ambulatory Visit: Payer: BLUE CROSS/BLUE SHIELD | Admitting: Nurse Practitioner

## 2018-05-06 ENCOUNTER — Encounter: Payer: Self-pay | Admitting: Nurse Practitioner

## 2018-05-06 VITALS — BP 95/57 | HR 72 | Temp 97.0°F | Ht 69.0 in | Wt 217.0 lb

## 2018-05-06 DIAGNOSIS — M5442 Lumbago with sciatica, left side: Secondary | ICD-10-CM

## 2018-05-06 DIAGNOSIS — G8929 Other chronic pain: Secondary | ICD-10-CM | POA: Diagnosis not present

## 2018-05-06 MED ORDER — METHYLPREDNISOLONE ACETATE 80 MG/ML IJ SUSP
80.0000 mg | Freq: Once | INTRAMUSCULAR | Status: AC
  Administered 2018-05-06: 80 mg via INTRAMUSCULAR

## 2018-05-06 MED ORDER — NAPROXEN 500 MG PO TABS: 500.0000 mg | ORAL_TABLET | Freq: Two times a day (BID) | ORAL | 1 refills | Status: DC

## 2018-05-06 NOTE — Patient Instructions (Signed)

## 2018-05-06 NOTE — Progress Notes (Signed)
   Subjective:    Patient ID: Vincent [name], male    DOB: Nov 24, 1985, 32 y.o.   MRN: [id number]   Chief Complaint: Back Pain (Low back)   HPI Patient come sin today c/o back pain. Started 16 days ago. He was working on put p a tree stand and injured his back. He has been using icy hot and motrin. He says pain is in left lower back and left leg goes numb when he is sitting in certain positions. Rates pain 7/10. Laying increases pain. Moving can decrease pain.  * has history of back problems and has taken muscle relaxer's in the past. Last lumbar spine xray shows l4-5 disc narrowing. Providers have wanted to do MRI in past but is to expensive.   Review of Systems  Constitutional: Negative.   HENT: Negative.   Respiratory: Negative.   Cardiovascular: Negative.   Gastrointestinal: Negative.   Genitourinary: Negative.   Musculoskeletal: Positive for back pain.  Neurological: Negative.   Psychiatric/Behavioral: Negative.   All other systems reviewed and are negative.      Objective:   Physical Exam  Constitutional: He appears well-developed and well-nourished. He appears distressed (mild).  Cardiovascular: Normal rate.  Pulmonary/Chest: Effort normal.  Abdominal: Soft.  Musculoskeletal:  Decrease ROM of lumbar spine with pain on flexion and rotation. (-) SLR bil Motor strength and sensation distally intact  Neurological: He displays normal reflexes.  Skin: Skin is warm.  Psychiatric: He has a normal mood and affect. His behavior is normal. Thought content normal.    BP (!) 95/57   Pulse 72   Temp (!) 97 F (36.1 C) (Oral)   Ht 5\' 9"  (1.753 m)   Wt 217 lb (98.4 kg)   BMI 32.05 kg/m        Assessment & Plan:  Vincent [name] in today with chief complaint of Back Pain (Low back)   1. Chronic bilateral low back pain with left-sided sciatica Moist heat No heavy lifting Back strengthening exercises - naproxen (NAPROSYN) 500 MG tablet; Take 1 tablet (500 mg total)  by mouth 2 (two) times daily with a meal.  Dispense: 60 tablet; Refill: 1 - methylPREDNISolone acetate (DEPO-MEDROL) injection 80 m  Mary-Margaret Daphine DeutscherMartin, FNP

## 2018-05-13 ENCOUNTER — Telehealth: Payer: Self-pay | Admitting: Physician Assistant

## 2018-05-13 DIAGNOSIS — G8929 Other chronic pain: Secondary | ICD-10-CM

## 2018-05-13 DIAGNOSIS — M5442 Lumbago with sciatica, left side: Principal | ICD-10-CM

## 2018-05-13 NOTE — Telephone Encounter (Signed)
Please advise 

## 2018-05-14 ENCOUNTER — Other Ambulatory Visit: Payer: Self-pay | Admitting: Physician Assistant

## 2018-05-14 MED ORDER — DICLOFENAC SODIUM 75 MG PO TBEC: 75.0000 mg | DELAYED_RELEASE_TABLET | Freq: Two times a day (BID) | ORAL | 1 refills | Status: DC

## 2018-05-14 NOTE — Telephone Encounter (Signed)
Can we refer to [facility] either [place] or [place] office?

## 2018-05-14 NOTE — Telephone Encounter (Signed)
Pt requesting refill on Diclofenac Pt states Naproxen is not working Please advise

## 2018-05-14 NOTE — Telephone Encounter (Signed)
Sent diclofenac.

## 2018-05-15 NOTE — Telephone Encounter (Signed)
Left detailed message regarding RX 

## 2018-05-22 ENCOUNTER — Ambulatory Visit: Payer: BLUE CROSS/BLUE SHIELD | Admitting: Physician Assistant

## 2018-06-06 ENCOUNTER — Encounter: Payer: Self-pay | Admitting: Family Medicine

## 2018-06-06 ENCOUNTER — Ambulatory Visit: Payer: BLUE CROSS/BLUE SHIELD | Admitting: Family Medicine

## 2018-06-06 VITALS — BP 134/82 | HR 107 | Temp 99.2°F | Ht 69.0 in | Wt 215.6 lb

## 2018-06-06 DIAGNOSIS — G8929 Other chronic pain: Secondary | ICD-10-CM

## 2018-06-06 DIAGNOSIS — M5442 Lumbago with sciatica, left side: Secondary | ICD-10-CM

## 2018-06-06 MED ORDER — PREDNISONE 20 MG PO TABS: ORAL_TABLET | ORAL | 0 refills | Status: DC

## 2018-06-06 MED ORDER — KETOROLAC TROMETHAMINE 60 MG/2ML IM SOLN
60.0000 mg | Freq: Once | INTRAMUSCULAR | Status: AC
  Administered 2018-06-06: 60 mg via INTRAMUSCULAR

## 2018-06-06 NOTE — Patient Instructions (Signed)
Sciatica Sciatica is pain, numbness, weakness, or tingling along your sciatic nerve. The sciatic nerve starts in the lower back and goes down the back of each leg. Sciatica happens when this nerve is pinched or has pressure put on it. Sciatica usually goes away on its own or with treatment. Sometimes, sciatica may keep coming back (recur). Follow these instructions at home: Medicines  Take over-the-counter and prescription medicines only as told by your doctor.  Do not drive or use heavy machinery while taking prescription pain medicine. Managing pain  If directed, put ice on the affected area. ? Put ice in a plastic bag. ? Place a towel between your skin and the bag. ? Leave the ice on for 20 minutes, 2-3 times a day.  After icing, apply heat to the affected area before you exercise or as often as told by your doctor. Use the heat source that your doctor tells you to use, such as a moist heat pack or a heating pad. ? Place a towel between your skin and the heat source. ? Leave the heat on for 20-30 minutes. ? Remove the heat if your skin turns bright red. This is especially important if you are unable to feel pain, heat, or cold. You may have a greater risk of getting burned. Activity  Return to your normal activities as told by your doctor. Ask your doctor what activities are safe for you. ? Avoid activities that make your sciatica worse.  Take short rests during the day. Rest in a lying or standing position. This is usually better than sitting to rest. ? When you rest for a long time, do some physical activity or stretching between periods of rest. ? Avoid sitting for a long time without moving. Get up and move around at least one time each hour.  Exercise and stretch regularly, as told by your doctor.  Do not lift anything that is heavier than 10 lb (4.5 kg) while you have symptoms of sciatica. ? Avoid lifting heavy things even when you do not have symptoms. ? Avoid lifting heavy  things over and over.  When you lift objects, always lift in a way that is safe for your body. To do this, you should: ? Bend your knees. ? Keep the object close to your body. ? Avoid twisting. General instructions  Use good posture. ? Avoid leaning forward when you are sitting. ? Avoid hunching over when you are standing.  Stay at a healthy weight.  Wear comfortable shoes that support your feet. Avoid wearing high heels.  Avoid sleeping on a mattress that is too soft or too hard. You might have less pain if you sleep on a mattress that is firm enough to support your back.  Keep all follow-up visits as told by your doctor. This is important. Contact a doctor if:  You have pain that: ? Wakes you up when you are sleeping. ? Gets worse when you lie down. ? Is worse than the pain you have had in the past. ? Lasts longer than 4 weeks.  You lose weight for without trying. Get help right away if:  You cannot control when you pee (urinate) or poop (have a bowel movement).  You have weakness in any of these areas and it gets worse. ? Lower back. ? Lower belly (pelvis). ? Butt (buttocks). ? Legs.  You have redness or swelling of your back.  You have a burning feeling when you pee. This information is not intended to replace   advice given to you by your health care provider. Make sure you discuss any questions you have with your health care provider. Document Released: 06/19/2008 Document Revised: 02/16/2016 Document Reviewed: 05/20/2015 Elsevier Interactive Patient Education  2018 [employer]. Back Exercises If you have pain in your back, do these exercises 2-3 times each day or as told by your doctor. When the pain goes away, do the exercises once each day, but repeat the steps more times for each exercise (do more repetitions). If you do not have pain in your back, do these exercises once each day or as told by your doctor. Exercises Single Knee to Chest  Do these steps 3-5  times in a row for each leg: 1. Lie on your back on a firm bed or the floor with your legs stretched out. 2. Bring one knee to your chest. 3. Hold your knee to your chest by grabbing your knee or thigh. 4. Pull on your knee until you feel a gentle stretch in your lower back. 5. Keep doing the stretch for 10-30 seconds. 6. Slowly let go of your leg and straighten it.  Pelvic Tilt  Do these steps 5-10 times in a row: 1. Lie on your back on a firm bed or the floor with your legs stretched out. 2. Bend your knees so they point up to the ceiling. Your feet should be flat on the floor. 3. Tighten your lower belly (abdomen) muscles to press your lower back against the floor. This will make your tailbone point up to the ceiling instead of pointing down to your feet or the floor. 4. Stay in this position for 5-10 seconds while you gently tighten your muscles and breathe evenly.  Cat-Cow  Do these steps until your lower back bends more easily: 1. Get on your hands and knees on a firm surface. Keep your hands under your shoulders, and keep your knees under your hips. You may put padding under your knees. 2. Let your head hang down, and make your tailbone point down to the floor so your lower back is round like the back of a cat. 3. Stay in this position for 5 seconds. 4. Slowly lift your head and make your tailbone point up to the ceiling so your back hangs low (sags) like the back of a cow. 5. Stay in this position for 5 seconds.  Press-Ups  Do these steps 5-10 times in a row: 1. Lie on your belly (face-down) on the floor. 2. Place your hands near your head, about shoulder-width apart. 3. While you keep your back relaxed and keep your hips on the floor, slowly straighten your arms to raise the top half of your body and lift your shoulders. Do not use your back muscles. To make yourself more comfortable, you may change where you place your hands. 4. Stay in this position for 5 seconds. 5. Slowly  return to lying flat on the floor.  Bridges  Do these steps 10 times in a row: 1. Lie on your back on a firm surface. 2. Bend your knees so they point up to the ceiling. Your feet should be flat on the floor. 3. Tighten your butt muscles and lift your butt off of the floor until your waist is almost as high as your knees. If you do not feel the muscles working in your butt and the back of your thighs, slide your feet 1-2 inches farther away from your butt. 4. Stay in this position for 3-5 seconds.  5. Slowly lower your butt to the floor, and let your butt muscles relax.  If this exercise is too easy, try doing it with your arms crossed over your chest. Belly Crunches  Do these steps 5-10 times in a row: 1. Lie on your back on a firm bed or the floor with your legs stretched out. 2. Bend your knees so they point up to the ceiling. Your feet should be flat on the floor. 3. Cross your arms over your chest. 4. Tip your chin a little bit toward your chest but do not bend your neck. 5. Tighten your belly muscles and slowly raise your chest just enough to lift your shoulder blades a tiny bit off of the floor. 6. Slowly lower your chest and your head to the floor.  Back Lifts Do these steps 5-10 times in a row: 1. Lie on your belly (face-down) with your arms at your sides, and rest your forehead on the floor. 2. Tighten the muscles in your legs and your butt. 3. Slowly lift your chest off of the floor while you keep your hips on the floor. Keep the back of your head in line with the curve in your back. Look at the floor while you do this. 4. Stay in this position for 3-5 seconds. 5. Slowly lower your chest and your face to the floor.  Contact a doctor if:  Your back pain gets a lot worse when you do an exercise.  Your back pain does not lessen 2 hours after you exercise. If you have any of these problems, stop doing the exercises. Do not do them again unless your doctor says it is  okay. Get help right away if:  You have sudden, very bad back pain. If this happens, stop doing the exercises. Do not do them again unless your doctor says it is okay. This information is not intended to replace advice given to you by your health care provider. Make sure you discuss any questions you have with your health care provider. Document Released: 10/13/2010 Document Revised: 02/16/2016 Document Reviewed: 11/04/2014 Elsevier Interactive Patient Education  [employer]2018 [employer]

## 2018-06-06 NOTE — Progress Notes (Signed)
Subjective:    Patient ID: Vincent [name], male    DOB: [birth year], 32 y.o.   MRN: [id number]  Chief Complaint:  Back Pain   HPI: Vincent [name] is a 32 y.o. male presenting on 06/06/2018 for Back Pain  Pt presents today for chronic back pain. Pt states he was seen on 05/06/18 for the same. Pt states he was given a steroid shot that decreased the pain for 2-3 days, but then it came back. Pt states the pain is in his left lower back, left buttock, and left hip. Pt states the pain radiates to his left groin and leg. He reports the pain as shooting and sharp in nature, reports pain level 10/10. Pt states the pain is constant. States he has tried heat, NSAIDS, epsom salt baths, and position changes without full relief of the pain. Pt states he is unable to rest well due to this pain. Pt states he has an appointment with a specialist on 06/16/18 for this.   Relevant past medical, surgical, family and social history reviewed and updated as indicated. Interim medical history since our last visit reviewed. Allergies and medications reviewed and updated. DATA REVIEWED: CHART IN EPIC  Family History reviewed for pertinent findings.  Past Medical History:  Diagnosis Date  . ADHD (attention deficit hyperactivity disorder)   . Anxiety   . Asthma     Past Surgical History:  Procedure Laterality Date  . MANDIBLE SURGERY  2009  . VASECTOMY  2007    Social History   Socioeconomic History  . Marital status: Married    Spouse name: Not on file  . Number of children: Not on file  . Years of education: Not on file  . Highest education level: Not on file  Occupational History  . Not on file  Social Needs  . Financial resource strain: Not on file  . Food insecurity:    Worry: Not on file    Inability: Not on file  . Transportation needs:    Medical: Not on file    Non-medical: Not on file  Tobacco Use  . Smoking status: Former Smoker    Packs/day: 0.50  . Smokeless tobacco: Never  Used  Substance and Sexual Activity  . Alcohol use: No  . Drug use: No  . Sexual activity: Yes    Birth control/protection: Surgical  Lifestyle  . Physical activity:    Days per week: Not on file    Minutes per session: Not on file  . Stress: Not on file  Relationships  . Social connections:    Talks on phone: Not on file    Gets together: Not on file    Attends religious service: Not on file    Active member of club or organization: Not on file    Attends meetings of clubs or organizations: Not on file    Relationship status: Not on file  . Intimate partner violence:    Fear of current or ex partner: Not on file    Emotionally abused: Not on file    Physically abused: Not on file    Forced sexual activity: Not on file  Other Topics Concern  . Not on file  Social History Narrative   Patient is a [employer]   He is married    Allergies as of 06/06/2018      Reactions   Tigan [trimethobenzamide Hcl]       Medication List  Accurate as of 06/06/18  9:53 AM. Always use your most recent med list.          albuterol 108 (90 Base) MCG/ACT inhaler Commonly known as:  PROVENTIL HFA;VENTOLIN HFA Inhale 2 puffs into the lungs every 6 (six) hours as needed for wheezing.   diclofenac 75 MG EC tablet Commonly known as:  VOLTAREN Take 1 tablet (75 mg total) by mouth 2 (two) times daily.   predniSONE 20 MG tablet Commonly known as:  DELTASONE 2 po at sametime daily for 5 days   tizanidine 6 MG capsule Commonly known as:  ZANAFLEX TAKE 1 CAPSULE (6 MG TOTAL) BY MOUTH 3 (THREE) TIMES DAILY.   trazodone 300 MG tablet Commonly known as:  DESYREL TAKE 1 TABLET (300 MG TOTAL) BY MOUTH AT BEDTIME AS NEEDED FOR SLEEP.       Allergies  Allergen Reactions  . Tigan [Trimethobenzamide Hcl]     Review of Systems  Constitutional: Positive for activity change. Negative for chills and fever.  Cardiovascular: Negative for leg swelling.  Gastrointestinal: Negative for  constipation and diarrhea.  Genitourinary: Negative for difficulty urinating.  Musculoskeletal: Positive for arthralgias, back pain and myalgias. Negative for gait problem and joint swelling.  Neurological: Negative for weakness and numbness.  Psychiatric/Behavioral: Positive for sleep disturbance. Negative for suicidal ideas.  All other systems reviewed and are negative.       Objective:    BP 134/82   Pulse (!) 107   Temp 99.2 F (37.3 C) (Oral)   Ht 5\' 9"  (1.753 m)   Wt 215 lb 9.6 oz (97.8 kg)   BMI 31.84 kg/m    Wt Readings from Last 3 Encounters:  06/06/18 215 lb 9.6 oz (97.8 kg)  05/06/18 217 lb (98.4 kg)  02/24/18 217 lb (98.4 kg)    Physical Exam  Constitutional: He is oriented to person, place, and time. He appears well-developed and well-nourished. He appears distressed (mild).  HENT:  Head: Normocephalic.  Neck: Normal range of motion. Neck supple.  Cardiovascular: Normal rate, regular rhythm and normal heart sounds.  Pulmonary/Chest: Effort normal and breath sounds normal.  Musculoskeletal: Normal range of motion.       Lumbar back: He exhibits tenderness and pain. He exhibits normal range of motion, no swelling, no edema, no deformity and normal pulse.       Back:  FROM: pain with flexion and extension of lumbar spine. Positive bilateral straight leg raise test.   Neurological: He is alert and oriented to person, place, and time. He has normal strength and normal reflexes.  Skin: Skin is warm and dry. Capillary refill takes less than 2 seconds.  Psychiatric: His speech is normal and behavior is normal. Judgment and thought content normal. His mood appears anxious.  Nursing note and vitals reviewed.       Assessment & Plan:   1. Chronic left-sided low back pain with left-sided sciatica Continue NSAIDS and muscle relaxers as previously prescribed. Avoid heavy lifting. Back exercises. Moist heat. Keep appointment with specialist.  - predniSONE (DELTASONE)  20 MG tablet; 2 po at sametime daily for 5 days  Dispense: 10 tablet; Refill: 0 - ketorolac (TORADOL) injection 60 mg     Follow up plan: Return if symptoms worsen or fail to improve.  Educational handout given for sciatica, back exercises  The above assessment and management plan was discussed with the patient. The patient verbalized understanding of and has agreed to the management plan. Patient is aware to call  the clinic if symptoms persist or worsen. Patient is aware when to return to the clinic for a follow-up visit. Patient educated on when it is appropriate to go to the emergency department.   Michelle Chelsia Serres, FNP-C [facility] [phone]  

## 2018-06-20 ENCOUNTER — Ambulatory Visit (INDEPENDENT_AMBULATORY_CARE_PROVIDER_SITE_OTHER): Payer: BLUE CROSS/BLUE SHIELD | Admitting: Orthopaedic Surgery

## 2018-07-23 ENCOUNTER — Other Ambulatory Visit: Payer: Self-pay | Admitting: Family Medicine

## 2018-07-23 DIAGNOSIS — M62830 Muscle spasm of back: Secondary | ICD-10-CM

## 2018-07-23 NOTE — Telephone Encounter (Signed)
Last seen 06/06/18

## 2018-08-02 ENCOUNTER — Ambulatory Visit: Payer: Managed Care, Other (non HMO) | Admitting: Family Medicine

## 2018-08-02 VITALS — BP 143/76 | HR 69 | Temp 98.4°F | Ht 69.0 in | Wt 225.0 lb

## 2018-08-02 DIAGNOSIS — Z8614 Personal history of Methicillin resistant Staphylococcus aureus infection: Secondary | ICD-10-CM | POA: Diagnosis not present

## 2018-08-02 DIAGNOSIS — L03211 Cellulitis of face: Secondary | ICD-10-CM

## 2018-08-02 MED ORDER — DOXYCYCLINE HYCLATE 100 MG PO TABS: 100.0000 mg | ORAL_TABLET | Freq: Two times a day (BID) | ORAL | 0 refills | Status: DC

## 2018-08-02 MED ORDER — CLINDAMYCIN HCL 300 MG PO CAPS: 300.0000 mg | ORAL_CAPSULE | Freq: Three times a day (TID) | ORAL | 0 refills | Status: DC

## 2018-08-02 NOTE — Progress Notes (Signed)
BP (!) 143/76   Pulse 69   Temp 98.4 F (36.9 C)   Ht 5\' 9"  (1.753 m)   Wt 225 lb (102.1 kg)   BMI 33.23 kg/m    Subjective:    Patient ID: Vincent [name], male    DOB: 22-Feb-1986, 32 y.o.   MRN: [id number]  HPI: Vincent [name] is a 32 y.o. male presenting on 08/02/2018 for Facial Swelling (bump on left cheek )   HPI Swelling and redness and warmth on face Patient has developed what started out like a pimple that is spread and started swelling and becoming more red over the past couple days on his face.  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  He did go to his workplace and they gave him clindamycin which he just finished a week and a half ago and it did not seem to help that much and is come right back on his face again.  He denies it being to the point where he can get anything drained out of it but it is starting to swell and become more red on his face again.  The clindamycin did seem to keep it at bay but did not eradicate it when he took it.  He also is starting to develop 2 spots that look like small pimples on the left side of his neck and that is how they start.  He denies any fevers or chills or systemic symptoms at this point  Relevant past medical, surgical, family and social history reviewed and updated as indicated. Interim medical history since our last visit reviewed. Allergies and medications reviewed and updated.  Review of Systems  Constitutional: Negative for chills and fever.  Respiratory: Negative for shortness of breath and wheezing.   Cardiovascular: Negative for chest pain and leg swelling.  Musculoskeletal: Negative for back pain and gait problem.  Skin: Positive for rash.  All other systems reviewed and are negative.   Per HPI unless specifically indicated above   Allergies as of  08/02/2018      Reactions   Tigan [trimethobenzamide Hcl]       Medication List        Accurate as of 08/02/18  9:46 AM. Always use your most recent med list.          albuterol 108 (90 Base) MCG/ACT inhaler Commonly known as:  PROVENTIL HFA;VENTOLIN HFA Inhale 2 puffs into the lungs every 6 (six) hours as needed for wheezing.   clindamycin 300 MG capsule Commonly known as:  CLEOCIN Take 1 capsule (300 mg total) by mouth 3 (three) times daily.   diclofenac 75 MG EC tablet Commonly known as:  VOLTAREN Take 1 tablet (75 mg total) by mouth 2 (two) times daily.   doxycycline 100 MG tablet Commonly known as:  VIBRA-TABS Take 1 tablet (100 mg total) by mouth 2 (two) times daily. 1 po bid   predniSONE 20 MG tablet Commonly known as:  DELTASONE 2 po at sametime daily for 5 days   tizanidine 6 MG capsule Commonly known as:  ZANAFLEX TAKE 1 CAPSULE (6 MG TOTAL) BY MOUTH 3 (THREE) TIMES DAILY.   trazodone 300 MG tablet Commonly known as:  DESYREL TAKE 1 TABLET (300 MG TOTAL) BY MOUTH AT BEDTIME AS NEEDED FOR SLEEP.  Objective:    BP (!) 143/76   Pulse 69   Temp 98.4 F (36.9 C)   Ht 5\' 9"  (1.753 m)   Wt 225 lb (102.1 kg)   BMI 33.23 kg/m   Wt Readings from Last 3 Encounters:  08/02/18 225 lb (102.1 kg)  06/06/18 215 lb 9.6 oz (97.8 kg)  05/06/18 217 lb (98.4 kg)    Physical Exam  Constitutional: He is oriented to person, place, and time. He appears well-developed and well-nourished. No distress.  Eyes: Conjunctivae are normal. No scleral icterus.  Neurological: He is alert and oriented to person, place, and time. Coordination normal.  Skin: Skin is warm and dry. No rash noted. He is not diaphoretic.     Psychiatric: He has a normal mood and affect. His behavior is normal.  Nursing note and vitals reviewed.       Assessment & Plan:   Problem List Items Addressed This Visit    None    Visit Diagnoses    Cellulitis of face    -  Primary    Relevant Medications   clindamycin (CLEOCIN) 300 MG capsule   doxycycline (VIBRA-TABS) 100 MG tablet   History of MRSA infection       Relevant Medications   clindamycin (CLEOCIN) 300 MG capsule   doxycycline (VIBRA-TABS) 100 MG tablet      Patient has had some severe MRSA infections previously and has required both doxy and Clinda together to kill it.  He has just finished a course of clindamycin from his workplace and it did not seem to touch it that much Follow up plan: Return if symptoms worsen or fail to improve.  Counseling provided for all of the vaccine components No orders of the defined types were placed in this encounter.   Arville Care, MD [facility] 08/02/2018, 9:46 AM

## 2018-08-08 ENCOUNTER — Other Ambulatory Visit: Payer: Self-pay | Admitting: *Deleted

## 2018-08-08 ENCOUNTER — Other Ambulatory Visit: Payer: Self-pay | Admitting: Physician Assistant

## 2018-08-08 DIAGNOSIS — G479 Sleep disorder, unspecified: Secondary | ICD-10-CM

## 2018-08-08 MED ORDER — TRAZODONE HCL 300 MG PO TABS: 300.0000 mg | ORAL_TABLET | Freq: Every evening | ORAL | 1 refills | Status: DC | PRN

## 2018-08-26 ENCOUNTER — Encounter: Payer: Self-pay | Admitting: Family Medicine

## 2018-08-26 ENCOUNTER — Ambulatory Visit: Payer: Managed Care, Other (non HMO) | Admitting: Family Medicine

## 2018-08-26 VITALS — BP 137/88 | HR 92 | Temp 98.0°F | Ht 69.0 in | Wt 220.0 lb

## 2018-08-26 DIAGNOSIS — F900 Attention-deficit hyperactivity disorder, predominantly inattentive type: Secondary | ICD-10-CM | POA: Diagnosis not present

## 2018-08-26 DIAGNOSIS — J4520 Mild intermittent asthma, uncomplicated: Secondary | ICD-10-CM

## 2018-08-26 MED ORDER — ALBUTEROL SULFATE HFA 108 (90 BASE) MCG/ACT IN AERS: 2.0000 | INHALATION_SPRAY | Freq: Four times a day (QID) | RESPIRATORY_TRACT | 3 refills | Status: DC | PRN

## 2018-08-26 MED ORDER — ATOMOXETINE HCL 40 MG PO CAPS: 40.0000 mg | ORAL_CAPSULE | Freq: Every day | ORAL | 1 refills | Status: DC

## 2018-08-26 NOTE — Patient Instructions (Signed)

## 2018-08-26 NOTE — Progress Notes (Signed)
Subjective:    Patient ID: Vincent [name], male    DOB: Sep 21, 1986, 32 y.o.   MRN: [id number]  Chief Complaint:  Refill ADHD medication (had been on Adderall for years but stopped taking 2 years ago; would like to restart medication)   HPI: Vincent [name] is a 32 y.o. male presenting on 08/26/2018 for Refill ADHD medication (had been on Adderall for years but stopped taking 2 years ago; would like to restart medication)  Pt presents today for evaluation and treatment of ADHD. Pt states he was on Adderral in the past but has been off of it for over 2 years. States he has noticed an increase in his mind wondering and racing. States he will be working on one task but be thinking of another. He reports it takes a long time to complete a task and that he gets side tracked easily. Pt states he feels it is getting worse. States this has been ongoing for at least 9-10 months and he feels he needs to be back on medication for his ADHD. Pt states he has not seen a psychiatrist in the past, only a marriage counselor. ASRS screening completed.     Pt reports he also needs a refill on his albuterol inhaler. States he only has to use this occasionally. States he has not needed it in several months, but it has since expired. He does not need controller medications. States he only has to use his rescue inhaler with heavy exercise.   Relevant past medical, surgical, family, and social history reviewed and updated as indicated.  Allergies and medications reviewed and updated.   Past Medical History:  Diagnosis Date  . ADHD (attention deficit hyperactivity disorder)   . Anxiety   . Asthma     Past Surgical History:  Procedure Laterality Date  . MANDIBLE SURGERY  2009  . VASECTOMY  2007    Social History   Socioeconomic History  . Marital status: Married    Spouse name: Not on file  . Number of children: Not on file  . Years of education: Not on file  . Highest education level: Not on file   Occupational History  . Not on file  Social Needs  . Financial resource strain: Not on file  . Food insecurity:    Worry: Not on file    Inability: Not on file  . Transportation needs:    Medical: Not on file    Non-medical: Not on file  Tobacco Use  . Smoking status: Former Smoker    Packs/day: 0.50  . Smokeless tobacco: Never Used  Substance and Sexual Activity  . Alcohol use: No  . Drug use: No  . Sexual activity: Yes    Birth control/protection: Surgical  Lifestyle  . Physical activity:    Days per week: Not on file    Minutes per session: Not on file  . Stress: Not on file  Relationships  . Social connections:    Talks on phone: Not on file    Gets together: Not on file    Attends religious service: Not on file    Active member of club or organization: Not on file    Attends meetings of clubs or organizations: Not on file    Relationship status: Not on file  . Intimate partner violence:    Fear of current or ex partner: Not on file    Emotionally abused: Not on file    Physically abused: Not on  file    Forced sexual activity: Not on file  Other Topics Concern  . 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 albuterol (PROVENTIL HFA;VENTOLIN HFA) 108 (90 Base) MCG/ACT inhaler Inhale 2 puffs into the lungs every 6 (six) hours as needed for wheezing.  . tizanidine (ZANAFLEX) 6 MG capsule TAKE 1 CAPSULE (6 MG TOTAL) BY MOUTH 3 (THREE) TIMES DAILY. (Patient taking differently: Take 6 mg by mouth 3 (three) times daily as needed. )  . trazodone (DESYREL) 300 MG tablet Take 1 tablet (300 mg total) by mouth at bedtime as needed for sleep.  . [DISCONTINUED] albuterol (PROVENTIL HFA;VENTOLIN HFA) 108 (90 BASE) MCG/ACT inhaler Inhale 2 puffs into the lungs every 6 (six) hours as needed for wheezing.  Marland Kitchen atomoxetine (STRATTERA) 40 MG capsule Take 1 capsule (40 mg total) by  mouth daily. Take 40 mg for three days and then increase to 80 mg  . [DISCONTINUED] clindamycin (CLEOCIN) 300 MG capsule Take 1 capsule (300 mg total) by mouth 3 (three) times daily.  . [DISCONTINUED] diclofenac (VOLTAREN) 75 MG EC tablet Take 1 tablet (75 mg total) by mouth 2 (two) times daily.  . [DISCONTINUED] doxycycline (VIBRA-TABS) 100 MG tablet Take 1 tablet (100 mg total) by mouth 2 (two) times daily. 1 po bid  . [DISCONTINUED] predniSONE (DELTASONE) 20 MG tablet 2 po at sametime daily for 5 days (Patient not taking: Reported on 08/02/2018)   No facility-administered encounter medications on file as of 08/26/2018.     Allergies  Allergen Reactions  . Tigan [Trimethobenzamide Hcl]     Review of Systems  Constitutional: Positive for activity change. Negative for appetite change, chills, fatigue and fever.  Respiratory: Negative for chest tightness and shortness of breath.   Cardiovascular: Negative for chest pain, palpitations and leg swelling.  Gastrointestinal: Negative for abdominal pain.  Neurological: Negative for tremors, weakness, light-headedness and headaches.  Psychiatric/Behavioral: Positive for decreased concentration and sleep disturbance. Negative for agitation, behavioral problems, confusion, dysphoric mood, hallucinations, self-injury and suicidal ideas. The patient is nervous/anxious and is hyperactive.         Objective:    BP 137/88   Pulse 92   Temp 98 F (36.7 C) (Oral)   Ht _0  (1.753 m)   Wt 220 lb (99.8 kg)   BMI 32.49 kg/m    Wt Readings from Last 3 Encounters:  08/26/18 220 lb (99.8 kg)  08/02/18 225 lb (102.1 kg)  06/06/18 215 lb 9.6 oz (97.8 kg)    Physical Exam  Constitutional: He is oriented to person, place, and time. He appears well-developed and well-nourished. He is cooperative. No distress.  HENT:  Head: Normocephalic and atraumatic.  Eyes: Pupils are equal, round, and reactive to light. EOM are normal.  Neck: No thyroid mass and  no thyromegaly present.  Cardiovascular: Normal rate, regular rhythm and normal heart sounds. Exam reveals no gallop and no friction rub.  No murmur heard. Pulmonary/Chest: Effort normal and breath sounds normal. No respiratory distress.  Neurological: He is alert and oriented to person, place, and time.  Skin: Skin is warm and dry. Capillary refill takes less than 2 seconds.  Psychiatric: His behavior is normal. Judgment and thought content normal. His mood appears anxious. His speech is rapid and/or pressured. Cognition and memory are normal.  Nursing note and vitals reviewed.   Results for  orders placed or performed in visit on 09/07/13  CMP14+EGFR  Result Value Ref Range   Glucose 93 65 - 99 mg/dL   BUN 6 6 - 20 mg/dL   Creatinine, Ser 1.05 0.76 - 1.27 mg/dL   GFR calc non Af Amer 96 >59 mL/min/1.73   GFR calc Af Amer 111 >59 mL/min/1.73   BUN/Creatinine Ratio 6 (L) 8 - 19   Sodium 144 134 - 144 mmol/L   Potassium 3.4 (L) 3.5 - 5.2 mmol/L   Chloride 103 97 - 108 mmol/L   CO2 22 18 - 29 mmol/L   Calcium 9.2 8.7 - 10.2 mg/dL   Total Protein 6.6 6.0 - 8.5 g/dL   Albumin 4.5 3.5 - 5.5 g/dL   Globulin, Total 2.1 1.5 - 4.5 g/dL   Albumin/Globulin Ratio 2.1 1.1 - 2.5   Total Bilirubin 0.4 0.0 - 1.2 mg/dL   Alkaline Phosphatase 67 39 - 117 IU/L   AST 19 0 - 40 IU/L   ALT 20 0 - 44 IU/L  Lipid panel  Result Value Ref Range   Cholesterol, Total 182 100 - 199 mg/dL   Triglycerides 72 0 - 149 mg/dL   HDL 54 >39 mg/dL   VLDL Cholesterol Cal 14 5 - 40 mg/dL   LDL Calculated 114 (H) 0 - 99 mg/dL   Chol/HDL Ratio 3.4 0.0 - 5.0 ratio units  Thyroid Panel With TSH  Result Value Ref Range   TSH 2.750 0.450 - 4.500 uIU/mL   T4, Total 6.1 4.5 - 12.0 ug/dL   T3 Uptake Ratio 32 24 - 39 %   Free Thyroxine Index 2.0 1.2 - 4.9  POCT CBC  Result Value Ref Range   WBC 6.0 4.6 - 10.2 K/uL   Lymph, poc 1.9 0.6 - 3.4   POC LYMPH PERCENT 32.4 10 - 50 %L   MID (cbc)  0 - 0.9   POC MID %  0 -  12 %M   POC Granulocyte 3.6 2 - 6.9   Granulocyte percent 59.7 37 - 80 %G   RBC 5.0 4.69 - 6.13 M/uL   Hemoglobin 14.6 14.1 - 18.1 g/dL   HCT, POC 44.5 43.5 - 53.7 %   MCV 88.6 80 - 97 fL   MCH, POC 29.2 27 - 31.2 pg   MCHC 32.9 31.8 - 35.4 g/dL   RDW, POC 12.8 %   Platelet Count, POC 340.0 142 - 424 K/uL   MPV 6.6 0 - 99.8 fL       Pertinent labs & imaging results that were available during my care of the patient were reviewed by me and considered in my medical decision making.  Assessment & Plan:  Drayson was seen today for refill adhd medication.  Diagnoses and all orders for this visit:  Attention deficit hyperactivity disorder (ADHD), predominantly inattentive type Stress management discussed.  Pt wanted to be placed back on Adderral, discussed starting on Strattera instead, pt agrees to treatment option. Will start on 40 mg daily for 3 days and then increase to 80 mg. Referral to psychiatry made.  -     atomoxetine (STRATTERA) 40 MG capsule; Take 1 capsule (40 mg total) by mouth daily. Take 40 mg for three days and then increase to 80 mg -     Ambulatory referral to Psychiatry  Mild intermittent asthma without complication -     albuterol (PROVENTIL HFA;VENTOLIN HFA) 108 (90 Base) MCG/ACT inhaler; Inhale 2 puffs into the lungs every 6 (six)  hours as needed for wheezing.  Continue all other maintenance medications.  Follow up plan: Return in about 4 weeks (around 09/23/2018).  Educational handout given for ADHD  The above assessment and management plan was discussed with the patient. The patient verbalized understanding of and has agreed to the management plan. Patient is aware to call the clinic if symptoms persist or worsen. Patient is aware when to return to the clinic for a follow-up visit. Patient educated on when it is appropriate to go to the emergency department.   Monia Pouch, FNP-C [facility] [phone]

## 2018-09-25 ENCOUNTER — Ambulatory Visit: Payer: Managed Care, Other (non HMO) | Admitting: Family Medicine

## 2018-09-26 ENCOUNTER — Encounter: Payer: Self-pay | Admitting: Family Medicine

## 2018-10-01 ENCOUNTER — Other Ambulatory Visit: Payer: Self-pay | Admitting: Family Medicine

## 2018-10-01 DIAGNOSIS — F900 Attention-deficit hyperactivity disorder, predominantly inattentive type: Secondary | ICD-10-CM

## 2018-10-01 NOTE — Telephone Encounter (Signed)
Lmtcb to schedule appt for refills. 

## 2018-10-01 NOTE — Telephone Encounter (Signed)
He missed his follow up appointment, he no showed. He needs to be seen as this was a new start medication.

## 2018-10-01 NOTE — Telephone Encounter (Signed)
Last seen 08/26/18  Vincent [name]

## 2018-10-20 ENCOUNTER — Other Ambulatory Visit: Payer: Self-pay | Admitting: Physician Assistant

## 2018-10-20 DIAGNOSIS — G479 Sleep disorder, unspecified: Secondary | ICD-10-CM

## 2018-10-21 ENCOUNTER — Other Ambulatory Visit: Payer: Self-pay | Admitting: Physician Assistant

## 2018-10-21 DIAGNOSIS — M62830 Muscle spasm of back: Secondary | ICD-10-CM

## 2018-10-21 NOTE — Telephone Encounter (Signed)
He needs to be reevaluated if he is still having symptoms.

## 2018-10-21 NOTE — Telephone Encounter (Signed)
Last seen 08/26/18  

## 2018-10-27 NOTE — Telephone Encounter (Signed)
Left message- ntbs for refill

## 2019-01-12 ENCOUNTER — Other Ambulatory Visit: Payer: Self-pay

## 2019-01-12 ENCOUNTER — Ambulatory Visit (INDEPENDENT_AMBULATORY_CARE_PROVIDER_SITE_OTHER): Payer: Managed Care, Other (non HMO) | Admitting: Family Medicine

## 2019-01-12 ENCOUNTER — Encounter: Payer: Self-pay | Admitting: Family Medicine

## 2019-01-12 DIAGNOSIS — G8929 Other chronic pain: Secondary | ICD-10-CM | POA: Diagnosis not present

## 2019-01-12 DIAGNOSIS — M62830 Muscle spasm of back: Secondary | ICD-10-CM | POA: Diagnosis not present

## 2019-01-12 DIAGNOSIS — M5442 Lumbago with sciatica, left side: Secondary | ICD-10-CM

## 2019-01-12 MED ORDER — TIZANIDINE HCL 6 MG PO CAPS: 6.0000 mg | ORAL_CAPSULE | Freq: Three times a day (TID) | ORAL | 0 refills | Status: DC | PRN

## 2019-01-12 MED ORDER — PREDNISONE 20 MG PO TABS: ORAL_TABLET | ORAL | 0 refills | Status: DC

## 2019-01-12 MED ORDER — NAPROXEN 500 MG PO TABS: 500.0000 mg | ORAL_TABLET | Freq: Two times a day (BID) | ORAL | 1 refills | Status: DC

## 2019-01-12 NOTE — Progress Notes (Signed)
Attempted to call at 1005 no answer, message left for pt to call office.

## 2019-01-12 NOTE — Progress Notes (Signed)
Virtual Visit via telephone Note Due to COVID-19, visit is conducted virtually and was requested by patient.   I connected with Vincent [name] on 01/12/19 at 1020 by telephone and verified that I am speaking with the correct person using two identifiers. Vincent [name] is currently located at home and no one is currently with them during visit. The provider, Kari BaarsMichelle , FNP is located in their office at time of visit.  I discussed the limitations, risks, security and privacy concerns of performing an evaluation and management service by telephone and the availability of in person appointments. I also discussed with the patient that there may be a patient responsible charge related to this service. The patient expressed understanding and agreed to proceed.  Subjective:  Patient ID: Vincent [name], male    DOB: 1985/10/11, 33 y.o.   MRN: [id number]  Chief Complaint:  Back Pain   HPI: Vincent [name] is a 33 y.o. male presenting on 01/12/2019 for Back Pain   Pt reports increasing left lower back pain over the last 4 weeks. States he has chronic back pain and at times the pain becomes worse. States he was working on a car and twisted wrong causing a flare of his chronic low back pain. He states the pain is aching to sharp and radiates into his left leg. States she has shooting pain down his left leg. States the pain is 8/10 at worst. States he is very stiff in the mornings and it takes a long time time get going. States he has not tried any medications for the pain. He denies priapism, saddle anesthesia, fever, chills, weight loss, night sweats, changes in bowel or bladder function, weakness, or loss of function.    Relevant past medical, surgical, family, and social history reviewed and updated as indicated.  Allergies and medications reviewed and updated.   Past Medical History:  Diagnosis Date  . ADHD (attention deficit hyperactivity disorder)   . Anxiety   . Asthma     Past  Surgical History:  Procedure Laterality Date  . MANDIBLE SURGERY  2009  . VASECTOMY  2007    Social History   Socioeconomic History  . Marital status: Married    Spouse name: Not on file  . Number of children: Not on file  . Years of education: Not on file  . Highest education level: Not on file  Occupational History  . Not on file  Social Needs  . Financial resource strain: Not on file  . Food insecurity:    Worry: Not on file    Inability: Not on file  . Transportation needs:    Medical: Not on file    Non-medical: Not on file  Tobacco Use  . Smoking status: Former Smoker    Packs/day: 0.50  . Smokeless tobacco: Never Used  Substance and Sexual Activity  . Alcohol use: No  . Drug use: No  . Sexual activity: Yes    Birth control/protection: Surgical  Lifestyle  . Physical activity:    Days per week: Not on file    Minutes per session: Not on file  . Stress: Not on file  Relationships  . Social connections:    Talks on phone: Not on file    Gets together: Not on file    Attends religious service: Not on file    Active member of club or organization: Not on file    Attends meetings of clubs or organizations: Not on file  Relationship status: Not on file  . Intimate partner violence:    Fear of current or ex partner: Not on file    Emotionally abused: Not on file    Physically abused: Not on file    Forced sexual activity: Not on file  Other Topics Concern  . 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 albuterol (PROVENTIL HFA;VENTOLIN HFA) 108 (90 Base) MCG/ACT inhaler Inhale 2 puffs into the lungs every 6 (six) hours as needed for wheezing.  Marland Kitchen atomoxetine (STRATTERA) 40 MG capsule Take 1 capsule (40 mg total) by mouth daily. Take 40 mg for three days and then increase to 80 mg  . naproxen (NAPROSYN) 500 MG tablet Take 1 tablet (500 mg total) by mouth  2 (two) times daily with a meal.  . predniSONE (DELTASONE) 20 MG tablet 2 po at sametime daily for 5 days  . tizanidine (ZANAFLEX) 6 MG capsule Take 1 capsule (6 mg total) by mouth 3 (three) times daily as needed.  . trazodone (DESYREL) 300 MG tablet Take 1 tablet (300 mg total) by mouth at bedtime as needed for sleep.  . [DISCONTINUED] tizanidine (ZANAFLEX) 6 MG capsule TAKE 1 CAPSULE (6 MG TOTAL) BY MOUTH 3 (THREE) TIMES DAILY. (Patient taking differently: Take 6 mg by mouth 3 (three) times daily as needed. )   No facility-administered encounter medications on file as of 01/12/2019.     Allergies  Allergen Reactions  . Tigan [Trimethobenzamide Hcl]     Review of Systems  Constitutional: Negative for activity change, appetite change, chills, diaphoresis, fatigue, fever and unexpected weight change.  Respiratory: Negative for cough and shortness of breath.   Cardiovascular: Negative for chest pain, palpitations and leg swelling.  Gastrointestinal: Negative for abdominal pain, anal bleeding, blood in stool, constipation, diarrhea, nausea, rectal pain and vomiting.  Genitourinary: Negative for difficulty urinating and urgency.  Musculoskeletal: Positive for arthralgias, back pain and myalgias. Negative for gait problem, joint swelling, neck pain and neck stiffness.  Neurological: Negative for dizziness, tremors, weakness, numbness and headaches.  Psychiatric/Behavioral: Negative for confusion.         Observations/Objective: No vital signs or physical exam, this was a telephone or virtual health encounter.  Pt alert and oriented, answers all questions appropriately, and able to speak in full sentences.    Assessment and Plan: Senan was seen today for back pain.  Diagnoses and all orders for this visit:  Chronic left-sided low back pain with left-sided sciatica Symptomatic care discussed. Moist heat. Back strengthening and stretching exercises discussed. Medications as  prescribed. Report any new or worsening symptoms.  -     tizanidine (ZANAFLEX) 6 MG capsule; Take 1 capsule (6 mg total) by mouth 3 (three) times daily as needed. -     predniSONE (DELTASONE) 20 MG tablet; 2 po at sametime daily for 5 days -     naproxen (NAPROSYN) 500 MG tablet; Take 1 tablet (500 mg total) by mouth 2 (two) times daily with a meal.  Muscle spasm of back -     tizanidine (ZANAFLEX) 6 MG capsule; Take 1 capsule (6 mg total) by mouth 3 (three) times daily as needed.     Follow Up Instructions: Return in about 6 weeks (around 02/23/2019), or if symptoms worsen or fail to improve, for back pain.    I discussed the assessment and treatment  plan with the patient. The patient was provided an opportunity to ask questions and all were answered. The patient agreed with the plan and demonstrated an understanding of the instructions.   The patient was advised to call back or seek an in-person evaluation if the symptoms worsen or if the condition fails to improve as anticipated.  The above assessment and management plan was discussed with the patient. The patient verbalized understanding of and has agreed to the management plan. Patient is aware to call the clinic if symptoms persist or worsen. Patient is aware when to return to the clinic for a follow-up visit. Patient educated on when it is appropriate to go to the emergency department.    I provided 15 minutes of non-face-to-face time during this encounter. The call started at 1020. The call ended at 1035.   Kari Baars, FNP-C [facility] [address] [place], [place] [address] [phone]

## 2019-02-23 ENCOUNTER — Other Ambulatory Visit: Payer: Self-pay | Admitting: Physician Assistant

## 2019-02-23 DIAGNOSIS — G479 Sleep disorder, unspecified: Secondary | ICD-10-CM

## 2019-02-23 NOTE — Telephone Encounter (Signed)
Last office visit: 12/25/2018

## 2019-03-15 ENCOUNTER — Other Ambulatory Visit: Payer: Self-pay | Admitting: Physician Assistant

## 2019-03-15 DIAGNOSIS — G479 Sleep disorder, unspecified: Secondary | ICD-10-CM

## 2019-03-16 NOTE — Telephone Encounter (Signed)
Last office visit 01/14/2019

## 2019-03-18 ENCOUNTER — Other Ambulatory Visit: Payer: Self-pay | Admitting: Physician Assistant

## 2019-03-18 DIAGNOSIS — G479 Sleep disorder, unspecified: Secondary | ICD-10-CM

## 2019-03-19 ENCOUNTER — Other Ambulatory Visit: Payer: Self-pay | Admitting: Family Medicine

## 2019-03-19 NOTE — Telephone Encounter (Signed)
Sent today

## 2019-06-09 ENCOUNTER — Telehealth: Payer: Self-pay | Admitting: Family Medicine

## 2019-06-09 NOTE — Telephone Encounter (Signed)
He is a patient of Monia Pouch, and we do not change providers anymore once established. I have reviewed the visits over the past couple of years.  I had seen him for back pain, never ADHD. I reviewed what Sharyn Lull did in offering him Psychiatry referral in December, which it appears he turned them down when they called him to schedule an appointment.  Treatment of adults with ADHD that is undocumented in the record requires psychiatry testing to progress with any type of treatment that is in the controlled category (adderall, ritalin, etc).  Therefore I cannot see him.

## 2019-06-10 NOTE — Telephone Encounter (Signed)
Patient aware.

## 2019-08-12 ENCOUNTER — Other Ambulatory Visit: Payer: Self-pay | Admitting: Family Medicine

## 2019-08-12 DIAGNOSIS — G479 Sleep disorder, unspecified: Secondary | ICD-10-CM

## 2019-09-08 ENCOUNTER — Other Ambulatory Visit: Payer: Self-pay | Admitting: Family Medicine

## 2019-09-08 DIAGNOSIS — G479 Sleep disorder, unspecified: Secondary | ICD-10-CM

## 2019-09-15 ENCOUNTER — Other Ambulatory Visit: Payer: Self-pay | Admitting: Family Medicine

## 2019-09-15 DIAGNOSIS — G479 Sleep disorder, unspecified: Secondary | ICD-10-CM

## 2019-09-22 ENCOUNTER — Other Ambulatory Visit: Payer: Self-pay

## 2019-09-23 ENCOUNTER — Ambulatory Visit (INDEPENDENT_AMBULATORY_CARE_PROVIDER_SITE_OTHER): Payer: Managed Care, Other (non HMO) | Admitting: Family Medicine

## 2019-09-23 ENCOUNTER — Encounter: Payer: Self-pay | Admitting: Family Medicine

## 2019-09-23 VITALS — BP 132/71 | HR 71 | Temp 99.0°F | Resp 20 | Ht 69.0 in | Wt 231.0 lb

## 2019-09-23 DIAGNOSIS — Z79899 Other long term (current) drug therapy: Secondary | ICD-10-CM | POA: Diagnosis not present

## 2019-09-23 DIAGNOSIS — Z6834 Body mass index (BMI) 34.0-34.9, adult: Secondary | ICD-10-CM

## 2019-09-23 DIAGNOSIS — I10 Essential (primary) hypertension: Secondary | ICD-10-CM | POA: Diagnosis not present

## 2019-09-23 DIAGNOSIS — F5101 Primary insomnia: Secondary | ICD-10-CM

## 2019-09-23 DIAGNOSIS — G4700 Insomnia, unspecified: Secondary | ICD-10-CM | POA: Insufficient documentation

## 2019-09-23 MED ORDER — LISINOPRIL 10 MG PO TABS: 10.0000 mg | ORAL_TABLET | Freq: Every day | ORAL | 11 refills | Status: DC

## 2019-09-23 MED ORDER — BELSOMRA 10 MG PO TABS: 10.0000 mg | ORAL_TABLET | Freq: Every evening | ORAL | 3 refills | Status: DC | PRN

## 2019-09-23 NOTE — Progress Notes (Signed)
Subjective:  Patient ID: Vincent [name], male    DOB: 02-26-1986, 33 y.o.   MRN: [id number]  Patient Care Team: Baruch Gouty, FNP as PCP - General (Family Medicine)   Chief Complaint:  Medical Management of Chronic Issues (refills )   HPI: Vincent [name] is a 33 y.o. male presenting on 09/23/2019 for Medical Management of Chronic Issues (refills )   Pt presents today for follow up of UC visit on 08/10/2019. Pt experienced palpitations and went to UC to be evaluated. States his heart rate was very elevated and his blood pressure was high. States he was started on lisinopril and Vistaril for anxiety. States he has not taken the vistaril but is taking his blood pressure medications as prescribed. States he was having headaches prior to going to UC, states this has subsided. States he has noticed he feels better since initiating the medication. He does report lack of sleep. He has been taking melatonin 12 mg and trazodone 300 mg nightly and is only getting 4 hours of sleep per night. States this is concerning to him as he is about to start working night shift. States he is tired most of the time and feels this will become worse working nights.   UC notes in EHR reviewed. EKG not available for review.     Relevant past medical, surgical, family, and social history reviewed and updated as indicated.  Allergies and medications reviewed and updated. Date reviewed: Chart in Epic.   Past Medical History:  Diagnosis Date  . ADHD (attention deficit hyperactivity disorder)   . Anxiety    panic   . Asthma     Past Surgical History:  Procedure Laterality Date  . MANDIBLE SURGERY  2009  . VASECTOMY  2007    Social History   Socioeconomic History  . Marital status: Married    Spouse name: Not on file  . Number of children: Not on file  . Years of education: Not on file  . Highest education level: Not on file  Occupational History  . Not on file  Tobacco Use  . Smoking  status: Former Smoker    Packs/day: 0.50  . Smokeless tobacco: Never Used  Substance and Sexual Activity  . Alcohol use: No  . Drug use: No  . Sexual activity: Yes    Birth control/protection: Surgical  Other Topics Concern  . Not on file  Social History Narrative   Patient is a Dealer   He is married   Social Determinants of Radio broadcast assistant Strain:   . Difficulty of Paying Living Expenses: Not on file  Food Insecurity:   . Worried About Charity fundraiser in the Last Year: Not on file  . Ran Out of Food in the Last Year: Not on file  Transportation Needs:   . Lack of Transportation (Medical): Not on file  . Lack of Transportation (Non-Medical): Not on file  Physical Activity:   . Days of Exercise per Week: Not on file  . Minutes of Exercise per Session: Not on file  Stress:   . Feeling of Stress : Not on file  Social Connections:   . Frequency of Communication with Friends and Family: Not on file  . Frequency of Social Gatherings with Friends and Family: Not on file  . Attends Religious Services: Not on file  . Active Member of Clubs or Organizations: Not on file  . Attends Archivist Meetings: Not on  file  . Marital Status: Not on file  Intimate Partner Violence:   . Fear of Current or Ex-Partner: Not on file  . Emotionally Abused: Not on file  . Physically Abused: Not on file  . Sexually Abused: Not on file    Outpatient Encounter Medications as of 09/23/2019  Medication Sig  . albuterol (PROVENTIL HFA;VENTOLIN HFA) 108 (90 Base) MCG/ACT inhaler Inhale 2 puffs into the lungs every 6 (six) hours as needed for wheezing.  Marland Kitchen lisinopril (ZESTRIL) 10 MG tablet Take 1 tablet (10 mg total) by mouth daily.  . [DISCONTINUED] lisinopril (ZESTRIL) 10 MG tablet Take 1 tablet by mouth daily.  . [DISCONTINUED] trazodone (DESYREL) 300 MG tablet TAKE 1 TABLET (300 MG TOTAL) BY MOUTH AT BEDTIME AS NEEDED FOR SLEEP.  . Suvorexant (BELSOMRA) 10 MG TABS Take 10  mg by mouth at bedtime as needed.  . [DISCONTINUED] atomoxetine (STRATTERA) 40 MG capsule Take 1 capsule (40 mg total) by mouth daily. Take 40 mg for three days and then increase to 80 mg  . [DISCONTINUED] naproxen (NAPROSYN) 500 MG tablet Take 1 tablet (500 mg total) by mouth 2 (two) times daily with a meal.  . [DISCONTINUED] predniSONE (DELTASONE) 20 MG tablet 2 po at sametime daily for 5 days  . [DISCONTINUED] tizanidine (ZANAFLEX) 6 MG capsule Take 1 capsule (6 mg total) by mouth 3 (three) times daily as needed.   No facility-administered encounter medications on file as of 09/23/2019.    Allergies  Allergen Reactions  . Tigan [Trimethobenzamide Hcl]     Review of Systems  Constitutional: Negative for activity change, appetite change, chills, diaphoresis, fatigue, fever and unexpected weight change.  HENT: Negative.   Eyes: Negative.  Negative for photophobia and visual disturbance.  Respiratory: Negative for cough, chest tightness and shortness of breath.   Cardiovascular: Negative for chest pain, palpitations and leg swelling.  Gastrointestinal: Negative for abdominal pain, blood in stool, constipation, diarrhea, nausea and vomiting.  Endocrine: Negative.  Negative for cold intolerance, heat intolerance, polydipsia, polyphagia and polyuria.  Genitourinary: Negative for decreased urine volume, difficulty urinating, dysuria, frequency and urgency.  Musculoskeletal: Negative for arthralgias and myalgias.  Skin: Negative.   Allergic/Immunologic: Negative.   Neurological: Negative for dizziness, tremors, seizures, syncope, facial asymmetry, speech difficulty, weakness, light-headedness, numbness and headaches.  Hematological: Negative.   Psychiatric/Behavioral: Positive for decreased concentration and sleep disturbance. Negative for agitation, behavioral problems, confusion, dysphoric mood, hallucinations, self-injury and suicidal ideas. The patient is nervous/anxious and is hyperactive.    All other systems reviewed and are negative.       Objective:  BP 132/71   Pulse 71   Temp 99 F (37.2 C)   Resp 20   Ht '5\' 9"'  (1.753 m)   Wt 231 lb (104.8 kg)   SpO2 97%   BMI 34.11 kg/m    Wt Readings from Last 3 Encounters:  09/23/19 231 lb (104.8 kg)  08/26/18 220 lb (99.8 kg)  08/02/18 225 lb (102.1 kg)    Physical Exam Vitals and nursing note reviewed.  Constitutional:      General: He is not in acute distress.    Appearance: Normal appearance. He is well-developed and well-groomed. He is obese. He is not ill-appearing, toxic-appearing or diaphoretic.  HENT:     Head: Normocephalic and atraumatic.     Jaw: There is normal jaw occlusion.     Right Ear: Hearing normal.     Left Ear: Hearing normal.     Nose: Nose  normal.     Mouth/Throat:     Lips: Pink.     Mouth: Mucous membranes are moist.     Pharynx: Oropharynx is clear. Uvula midline.  Eyes:     General: Lids are normal.     Extraocular Movements: Extraocular movements intact.     Conjunctiva/sclera: Conjunctivae normal.     Pupils: Pupils are equal, round, and reactive to light.  Neck:     Thyroid: No thyroid mass, thyromegaly or thyroid tenderness.     Vascular: No carotid bruit or JVD.     Trachea: Trachea and phonation normal.  Cardiovascular:     Rate and Rhythm: Normal rate and regular rhythm.     Chest Wall: PMI is not displaced.     Pulses: Normal pulses.     Heart sounds: Normal heart sounds. No murmur. No friction rub. No gallop.   Pulmonary:     Effort: Pulmonary effort is normal. No respiratory distress.     Breath sounds: Normal breath sounds. No wheezing.  Abdominal:     General: Bowel sounds are normal. There is no distension or abdominal bruit.     Palpations: Abdomen is soft. There is no hepatomegaly or splenomegaly.     Tenderness: There is no abdominal tenderness. There is no right CVA tenderness or left CVA tenderness.     Hernia: No hernia is present.  Musculoskeletal:          General: Normal range of motion.     Cervical back: Normal range of motion and neck supple.     Right lower leg: No edema.     Left lower leg: No edema.  Lymphadenopathy:     Cervical: No cervical adenopathy.  Skin:    General: Skin is warm and dry.     Capillary Refill: Capillary refill takes less than 2 seconds.     Coloration: Skin is not cyanotic, jaundiced or pale.     Findings: No rash.  Neurological:     General: No focal deficit present.     Mental Status: He is alert and oriented to person, place, and time.     Cranial Nerves: Cranial nerves are intact. No cranial nerve deficit.     Sensory: Sensation is intact. No sensory deficit.     Motor: Motor function is intact. No weakness.     Coordination: Coordination is intact. Coordination normal.     Gait: Gait is intact. Gait normal.     Deep Tendon Reflexes: Reflexes are normal and symmetric. Reflexes normal.  Psychiatric:        Attention and Perception: Attention and perception normal.        Mood and Affect: Mood and affect normal.        Speech: Speech is rapid and pressured.        Behavior: Behavior normal. Behavior is cooperative.        Thought Content: Thought content normal.        Cognition and Memory: Cognition and memory normal.        Judgment: Judgment normal.     Results for orders placed or performed in visit on 09/07/13  CMP14+EGFR  Result Value Ref Range   Glucose 93 65 - 99 mg/dL   BUN 6 6 - 20 mg/dL   Creatinine, Ser 1.05 0.76 - 1.27 mg/dL   GFR calc non Af Amer 96 >59 mL/min/1.73   GFR calc Af Amer 111 >59 mL/min/1.73   BUN/Creatinine Ratio 6 (L) 8 - 19  Sodium 144 134 - 144 mmol/L   Potassium 3.4 (L) 3.5 - 5.2 mmol/L   Chloride 103 97 - 108 mmol/L   CO2 22 18 - 29 mmol/L   Calcium 9.2 8.7 - 10.2 mg/dL   Total Protein 6.6 6.0 - 8.5 g/dL   Albumin 4.5 3.5 - 5.5 g/dL   Globulin, Total 2.1 1.5 - 4.5 g/dL   Albumin/Globulin Ratio 2.1 1.1 - 2.5   Total Bilirubin 0.4 0.0 - 1.2 mg/dL    Alkaline Phosphatase 67 39 - 117 IU/L   AST 19 0 - 40 IU/L   ALT 20 0 - 44 IU/L  Lipid panel  Result Value Ref Range   Cholesterol, Total 182 100 - 199 mg/dL   Triglycerides 72 0 - 149 mg/dL   HDL 54 >39 mg/dL   VLDL Cholesterol Cal 14 5 - 40 mg/dL   LDL Calculated 114 (H) 0 - 99 mg/dL   Chol/HDL Ratio 3.4 0.0 - 5.0 ratio units  Thyroid Panel With TSH  Result Value Ref Range   TSH 2.750 0.450 - 4.500 uIU/mL   T4, Total 6.1 4.5 - 12.0 ug/dL   T3 Uptake Ratio 32 24 - 39 %   Free Thyroxine Index 2.0 1.2 - 4.9  POCT CBC  Result Value Ref Range   WBC 6.0 4.6 - 10.2 K/uL   Lymph, poc 1.9 0.6 - 3.4   POC LYMPH PERCENT 32.4 10 - 50 %L   MID (cbc)  0 - 0.9   POC MID %  0 - 12 %M   POC Granulocyte 3.6 2 - 6.9   Granulocyte percent 59.7 37 - 80 %G   RBC 5.0 4.69 - 6.13 M/uL   Hemoglobin 14.6 14.1 - 18.1 g/dL   HCT, POC 44.5 43.5 - 53.7 %   MCV 88.6 80 - 97 fL   MCH, POC 29.2 27 - 31.2 pg   MCHC 32.9 31.8 - 35.4 g/dL   RDW, POC 12.8 %   Platelet Count, POC 340.0 142 - 424 K/uL   MPV 6.6 0 - 99.8 fL       Pertinent labs & imaging results that were available during my care of the patient were reviewed by me and considered in my medical decision making.  Assessment & Plan:  Vincent [name] was seen today for medical management of chronic issues.  Diagnoses and all orders for this visit:  Essential hypertension New diagnosis at [facility] on 08/10/2019. Records requested. Well controlled today. Will check below. EKG was completed at [facility], will request and review. DASH diet discussed. Weight management. Follow up in 3 months.  -     CMP14+EGFR -     CBC with Differential/Platelet -     Lipid panel -     Thyroid Panel With TSH -     lisinopril (ZESTRIL) 10 MG tablet; Take 1 tablet (10 mg total) by mouth daily.  BMI 34.0-34.9,adult Diet and exercise encouraged. Labs pending.  -     CMP14+EGFR -     CBC with Differential/Platelet -     Lipid panel -     Thyroid Panel With TSH  Primary  insomnia Controlled substance agreement signed Trazodone and melatonin ineffective. Will trial below. Sleep hygiene discussed in detail. Controlled substance agreement signed today, toxassure collected.  -     Suvorexant (BELSOMRA) 10 MG TABS; Take 10 mg by mouth at bedtime as needed. -     DRUG SCREEN-TOXASSURE  Total time spent with patient 40 minutes.  Greater than 50% of encounter spent in coordination of care/counseling.  Continue all other maintenance medications.  Follow up plan: Return in about 3 months (around 12/22/2019), or if symptoms worsen or fail to improve, for HTN, Insomnia.  Continue healthy lifestyle choices, including diet (rich in fruits, vegetables, and lean proteins, and low in salt and simple carbohydrates) and exercise (at least 30 minutes of moderate physical activity daily).  Educational handout given for insomnia, DASH diet  The above assessment and management plan was discussed with the patient. The patient verbalized understanding of and has agreed to the management plan. Patient is aware to call the clinic if they develop any new symptoms or if symptoms persist or worsen. Patient is aware when to return to the clinic for a follow-up visit. Patient educated on when it is appropriate to go to the emergency department.   Monia Pouch, FNP-C [facility] [phone]

## 2019-09-23 NOTE — Patient Instructions (Addendum)
Insomnia Insomnia is a sleep disorder that makes it difficult to fall asleep or stay asleep. Insomnia can cause fatigue, low energy, difficulty concentrating, mood swings, and poor performance at work or school. There are three different ways to classify insomnia:  Difficulty falling asleep.  Difficulty staying asleep.  Waking up too early in the morning. Any type of insomnia can be long-term (chronic) or short-term (acute). Both are common. Short-term insomnia usually lasts for three months or less. Chronic insomnia occurs at least three times a week for longer than three months. What are the causes? Insomnia may be caused by another condition, situation, or substance, such as:  Anxiety.  Certain medicines.  Gastroesophageal reflux disease (GERD) or other gastrointestinal conditions.  Asthma or other breathing conditions.  Restless legs syndrome, sleep apnea, or other sleep disorders.  Chronic pain.  Menopause.  Stroke.  Abuse of alcohol, tobacco, or illegal drugs.  Mental health conditions, such as depression.  Caffeine.  Neurological disorders, such as Alzheimer's disease.  An overactive thyroid (hyperthyroidism). Sometimes, the cause of insomnia may not be known. What increases the risk? Risk factors for insomnia include:  Gender. Women are affected more often than men.  Age. Insomnia is more common as you get older.  Stress.  Lack of exercise.  Irregular work schedule or working night shifts.  Traveling between different time zones.  Certain medical and mental health conditions. What are the signs or symptoms? If you have insomnia, the main symptom is having trouble falling asleep or having trouble staying asleep. This may lead to other symptoms, such as:  Feeling fatigued or having low energy.  Feeling nervous about going to sleep.  Not feeling rested in the morning.  Having trouble concentrating.  Feeling irritable, anxious, or  depressed. How is this diagnosed? This condition may be diagnosed based on:  Your symptoms and medical history. Your health care provider may ask about: ? Your sleep habits. ? Any medical conditions you have. ? Your mental health.  A physical exam. How is this treated? Treatment for insomnia depends on the cause. Treatment may focus on treating an underlying condition that is causing insomnia. Treatment may also include:  Medicines to help you sleep.  Counseling or therapy.  Lifestyle adjustments to help you sleep better. Follow these instructions at home: Eating and drinking   Limit or avoid alcohol, caffeinated beverages, and cigarettes, especially close to bedtime. These can disrupt your sleep.  Do not eat a large meal or eat spicy foods right before bedtime. This can lead to digestive discomfort that can make it hard for you to sleep. Sleep habits   Keep a sleep diary to help you and your health care provider figure out what could be causing your insomnia. Write down: ? When you sleep. ? When you wake up during the night. ? How well you sleep. ? How rested you feel the next day. ? Any side effects of medicines you are taking. ? What you eat and drink.  Make your bedroom a dark, comfortable place where it is easy to fall asleep. ? Put up shades or blackout curtains to block light from outside. ? Use a white noise machine to block noise. ? Keep the temperature cool.  Limit screen use before bedtime. This includes: ? Watching TV. ? Using your smartphone, tablet, or computer.  Stick to a routine that includes going to bed and waking up at the same times every day and night. This can help you fall asleep faster.  Consider making a quiet activity, such as reading, part of your nighttime routine.  Try to avoid taking naps during the day so that you sleep better at night.  Get out of bed if you are still awake after 15 minutes of trying to sleep. Keep the lights down, but  try reading or doing a quiet activity. When you feel sleepy, go back to bed. General instructions  Take over-the-counter and prescription medicines only as told by your health care provider.  Exercise regularly, as told by your health care provider. Avoid exercise starting several hours before bedtime.  Use relaxation techniques to manage stress. Ask your health care provider to suggest some techniques that may work well for you. These may include: ? Breathing exercises. ? Routines to release muscle tension. ? Visualizing peaceful scenes.  Make sure that you drive carefully. Avoid driving if you feel very sleepy.  Keep all follow-up visits as told by your health care provider. This is important. Contact a health care provider if:  You are tired throughout the day.  You have trouble in your daily routine due to sleepiness.  You continue to have sleep problems, or your sleep problems get worse. Get help right away if:  You have serious thoughts about hurting yourself or someone else. If you ever feel like you may hurt yourself or others, or have thoughts about taking your own life, get help right away. You can go to your nearest emergency department or call:  Your local emergency services (911 in the [place]  A suicide crisis helpline, such as the [facility] at 769-479-4358. This is open 24 hours a day. Summary  Insomnia is a sleep disorder that makes it difficult to fall asleep or stay asleep.  Insomnia can be long-term (chronic) or short-term (acute).  Treatment for insomnia depends on the cause. Treatment may focus on treating an underlying condition that is causing insomnia.  Keep a sleep diary to help you and your health care provider figure out what could be causing your insomnia. This information is not intended to replace advice given to you by your health care provider. Make sure you discuss any questions you have with your health care  provider. Document Released: 09/07/2000 Document Revised: 08/23/2017 Document Reviewed: 06/20/2017 Elsevier Patient Education  2020 [place] DASH stands for "Dietary Approaches to Stop Hypertension." The DASH eating plan is a healthy eating plan that has been shown to reduce high blood pressure (hypertension). Additional health benefits may include reducing the risk of type 2 diabetes mellitus, heart disease, and stroke. The DASH eating plan may also help with weight loss.  WHAT DO I NEED TO KNOW ABOUT THE DASH EATING PLAN? For the DASH eating plan, you will follow these general guidelines:  Choose foods with a percent daily value for sodium of less than 5% (as listed on the food label).  Use salt-free seasonings or herbs instead of table salt or sea salt.  Check with your health care provider or pharmacist before using salt substitutes.  Eat lower-sodium products, often labeled as "lower sodium" or "no salt added."  Eat fresh foods.  Eat more vegetables, fruits, and low-fat dairy products.  Choose whole grains. Look for the word "whole" as the first word in the ingredient list.  Choose fish and skinless chicken or [place] more often than red meat. Limit fish, poultry, and meat to 6 oz (170 g) each day.  Limit sweets, desserts, sugars, and sugary drinks.  Choose heart-healthy fats.  Limit cheese to 1 oz (28 g) per day.  Eat more home-cooked food and less restaurant, buffet, and fast food.  Limit fried foods.  Cook foods using methods other than frying.  Limit canned vegetables. If you do use them, rinse them well to decrease the sodium.  When eating at a restaurant, ask that your food be prepared with less salt, or no salt if possible.  WHAT FOODS CAN I EAT? Seek help from a dietitian for individual calorie needs.  Grains Whole grain or whole wheat bread. Brown rice. Whole grain or whole wheat pasta. Quinoa, bulgur, and whole grain cereals.  Low-sodium cereals. Corn or whole wheat flour tortillas. Whole grain cornbread. Whole grain crackers. Low-sodium crackers.  Vegetables Fresh or frozen vegetables (raw, steamed, roasted, or grilled). Low-sodium or reduced-sodium tomato and vegetable juices. Low-sodium or reduced-sodium tomato sauce and paste. Low-sodium or reduced-sodium canned vegetables.   Fruits All fresh, canned (in natural juice), or frozen fruits.  Meat and Other Protein Products Ground beef (85% or leaner), grass-fed beef, or beef trimmed of fat. Skinless chicken or [place]. Ground chicken or [place]. Pork trimmed of fat. All fish and seafood. Eggs. Dried beans, peas, or lentils. Unsalted nuts and seeds. Unsalted canned beans.  Dairy Low-fat dairy products, such as skim or 1% milk, 2% or reduced-fat cheeses, low-fat ricotta or cottage cheese, or plain low-fat yogurt. Low-sodium or reduced-sodium cheeses.  Fats and Oils Tub margarines without trans fats. Light or reduced-fat mayonnaise and salad dressings (reduced sodium). Avocado. Safflower, olive, or canola oils. Natural peanut or almond butter.  Other Unsalted popcorn and pretzels. The items listed above may not be a complete list of recommended foods or beverages. Contact your dietitian for more options.  WHAT FOODS ARE NOT RECOMMENDED?  Grains White bread. White pasta. White rice. Refined cornbread. Bagels and croissants. Crackers that contain trans fat.  Vegetables Creamed or fried vegetables. Vegetables in a cheese sauce. Regular canned vegetables. Regular canned tomato sauce and paste. Regular tomato and vegetable juices.  Fruits Dried fruits. Canned fruit in light or heavy syrup. Fruit juice.  Meat and Other Protein Products Fatty cuts of meat. Ribs, chicken wings, bacon, sausage, bologna, salami, chitterlings, fatback, hot dogs, bratwurst, and packaged luncheon meats. Salted nuts and seeds. Canned beans with salt.  Dairy Whole or 2% milk, cream,  half-and-half, and cream cheese. Whole-fat or sweetened yogurt. Full-fat cheeses or blue cheese. Nondairy creamers and whipped toppings. Processed cheese, cheese spreads, or cheese curds.  Condiments Onion and garlic salt, seasoned salt, table salt, and sea salt. Canned and packaged gravies. Worcestershire sauce. Tartar sauce. Barbecue sauce. Teriyaki sauce. Soy sauce, including reduced sodium. Steak sauce. Fish sauce. Oyster sauce. Cocktail sauce. Horseradish. Ketchup and mustard. Meat flavorings and tenderizers. Bouillon cubes. Hot sauce. Tabasco sauce. Marinades. Taco seasonings. Relishes.  Fats and Oils Butter, stick margarine, lard, shortening, ghee, and bacon fat. Coconut, palm kernel, or palm oils. Regular salad dressings.  Other Pickles and olives. Salted popcorn and pretzels.  The items listed above may not be a complete list of foods and beverages to avoid. Contact your dietitian for more information.  WHERE CAN I FIND MORE INFORMATION? National Heart, Lung, and Blood Institute: CablePromo.itwww.nhlbi.nih.gov/health/health-topics/topics/dash/ Document Released: 08/30/2011 Document Revised: 01/25/2014 Document Reviewed: 07/15/2013 [facility] Patient Information 2015 [place], [place]. This information is not intended to replace advice given to you by your health care provider. Make sure you discuss any questions you have with your health care provider.   I  think that you would greatly benefit from seeing a nutritionist.  If you are interested, please call Dr Gerilyn Pilgrim at [phone] to schedule an appointment.

## 2019-09-24 LAB — CMP14+EGFR
ALT: 23 IU/L (ref 0–44)
AST: 21 IU/L (ref 0–40)
Albumin/Globulin Ratio: 2.2 (ref 1.2–2.2)
Albumin: 4.3 g/dL (ref 4.0–5.0)
Alkaline Phosphatase: 62 IU/L (ref 39–117)
BUN/Creatinine Ratio: 18 (ref 9–20)
BUN: 16 mg/dL (ref 6–20)
Bilirubin Total: 0.4 mg/dL (ref 0.0–1.2)
CO2: 22 mmol/L (ref 20–29)
Calcium: 9.2 mg/dL (ref 8.7–10.2)
Chloride: 103 mmol/L (ref 96–106)
Creatinine, Ser: 0.91 mg/dL (ref 0.76–1.27)
GFR calc Af Amer: 127 mL/min/{1.73_m2} (ref >59)
GFR calc non Af Amer: 110 mL/min/{1.73_m2} (ref >59)
Globulin, Total: 2 g/dL (ref 1.5–4.5)
Glucose: 90 mg/dL (ref 65–99)
Potassium: 4.4 mmol/L (ref 3.5–5.2)
Sodium: 140 mmol/L (ref 134–144)
Total Protein: 6.3 g/dL (ref 6.0–8.5)

## 2019-09-24 LAB — CBC WITH DIFFERENTIAL/PLATELET
Basophils Absolute: 0 10*3/uL (ref 0.0–0.2)
Basos: 0 %
EOS (ABSOLUTE): 0.2 10*3/uL (ref 0.0–0.4)
Eos: 4 %
Hematocrit: 42.1 % (ref 37.5–51.0)
Hemoglobin: 14.4 g/dL (ref 13.0–17.7)
Immature Grans (Abs): 0 10*3/uL (ref 0.0–0.1)
Immature Granulocytes: 0 %
Lymphocytes Absolute: 2.6 10*3/uL (ref 0.7–3.1)
Lymphs: 46 %
MCH: 29.3 pg (ref 26.6–33.0)
MCHC: 34.2 g/dL (ref 31.5–35.7)
MCV: 86 fL (ref 79–97)
Monocytes Absolute: 0.5 10*3/uL (ref 0.1–0.9)
Monocytes: 8 %
Neutrophils Absolute: 2.4 10*3/uL (ref 1.4–7.0)
Neutrophils: 42 %
Platelets: 266 10*3/uL (ref 150–450)
RBC: 4.91 x10E6/uL (ref 4.14–5.80)
RDW: 13 % (ref 11.6–15.4)
WBC: 5.7 10*3/uL (ref 3.4–10.8)

## 2019-09-24 LAB — LIPID PANEL
Chol/HDL Ratio: 5 ratio (ref 0.0–5.0)
Cholesterol, Total: 217 mg/dL — ABNORMAL HIGH (ref 100–199)
HDL: 43 mg/dL (ref >39)
LDL Chol Calc (NIH): 129 mg/dL — ABNORMAL HIGH (ref 0–99)
Triglycerides: 251 mg/dL — ABNORMAL HIGH (ref 0–149)
VLDL Cholesterol Cal: 45 mg/dL — ABNORMAL HIGH (ref 5–40)

## 2019-09-24 LAB — THYROID PANEL WITH TSH
Free Thyroxine Index: 1.7 (ref 1.2–4.9)
T3 Uptake Ratio: 30 % (ref 24–39)
T4, Total: 5.5 ug/dL (ref 4.5–12.0)
TSH: 2.53 u[IU]/mL (ref 0.450–4.500)

## 2019-09-28 ENCOUNTER — Telehealth: Payer: Self-pay | Admitting: Family Medicine

## 2019-09-28 LAB — TOXASSURE SELECT 13 (MW), URINE

## 2019-09-28 NOTE — Telephone Encounter (Signed)
[email or web address]

## 2019-09-28 NOTE — Telephone Encounter (Signed)
Patient works 3rd shift states that the Belsomra is not helping wants to increase the dosage or go back on the trazodone. Please advise.

## 2019-09-28 NOTE — Telephone Encounter (Signed)
He can try taking 20 mg at night. If this is beneficial, he will need to let us know.

## 2019-09-28 NOTE — Telephone Encounter (Signed)
Patient aware and verbalizes understanding. 

## 2019-10-01 ENCOUNTER — Other Ambulatory Visit: Payer: Self-pay | Admitting: Family Medicine

## 2019-10-01 ENCOUNTER — Telehealth: Payer: Self-pay | Admitting: Family Medicine

## 2019-10-01 DIAGNOSIS — F5101 Primary insomnia: Secondary | ICD-10-CM

## 2019-10-01 MED ORDER — TRAZODONE HCL 300 MG PO TABS: 300.0000 mg | ORAL_TABLET | Freq: Every day | ORAL | 3 refills | Status: DC

## 2019-10-01 NOTE — Telephone Encounter (Signed)
Pt called stating that the medication dosage that he was taking for Belsomra was for 10mg  was recently told that he could take 20 mg but says that is really having an affect on him with not being able to sleep. Wants advise from PCP or nurse on what to do. Wants to know if he can start taking Trazadone again.

## 2019-10-01 NOTE — Telephone Encounter (Signed)
Pt states he takes the 20mg  Belsomra and tries to go to sleep but he took the Belsomra 2 hours ago and is still wide awake. Pt would like to know if he can just stop the Belsomra and go back to 300mg  Trazodone? Please advise.

## 2019-10-01 NOTE — Telephone Encounter (Signed)
Fine to go back to Trazodone. Please let pt know and notify pharmacy to D/C Belsomra refills.

## 2019-10-01 NOTE — Telephone Encounter (Signed)
Pt aware and pharmacy called and cancelled all remaining Belsomra refills.

## 2019-11-06 ENCOUNTER — Other Ambulatory Visit: Payer: Self-pay | Admitting: Family Medicine

## 2019-11-06 DIAGNOSIS — J4520 Mild intermittent asthma, uncomplicated: Secondary | ICD-10-CM

## 2019-12-18 ENCOUNTER — Ambulatory Visit: Payer: Managed Care, Other (non HMO) | Admitting: Family Medicine

## 2020-02-26 ENCOUNTER — Other Ambulatory Visit: Payer: Self-pay | Admitting: *Deleted

## 2020-02-26 DIAGNOSIS — J4520 Mild intermittent asthma, uncomplicated: Secondary | ICD-10-CM

## 2020-02-26 MED ORDER — ALBUTEROL SULFATE HFA 108 (90 BASE) MCG/ACT IN AERS: 2.0000 | INHALATION_SPRAY | Freq: Four times a day (QID) | RESPIRATORY_TRACT | 0 refills | Status: DC | PRN

## 2020-03-03 ENCOUNTER — Other Ambulatory Visit: Payer: Self-pay | Admitting: Family Medicine

## 2020-03-03 DIAGNOSIS — L03211 Cellulitis of face: Secondary | ICD-10-CM

## 2020-03-03 DIAGNOSIS — Z8614 Personal history of Methicillin resistant Staphylococcus aureus infection: Secondary | ICD-10-CM

## 2020-03-14 ENCOUNTER — Other Ambulatory Visit: Payer: Self-pay | Admitting: *Deleted

## 2020-03-14 DIAGNOSIS — F5101 Primary insomnia: Secondary | ICD-10-CM

## 2020-03-22 ENCOUNTER — Other Ambulatory Visit: Payer: Self-pay | Admitting: Nurse Practitioner

## 2020-03-22 DIAGNOSIS — F5101 Primary insomnia: Secondary | ICD-10-CM

## 2020-03-23 NOTE — Telephone Encounter (Signed)
Last seen with M Rakes 09/2019 ntbs for refills

## 2020-03-23 NOTE — Telephone Encounter (Signed)
Appointment scheduled for patient to be see with Gennette Pac on 03/25/2020 at 8:30 am.

## 2020-03-23 NOTE — Telephone Encounter (Signed)
Left message to call back  

## 2020-03-25 ENCOUNTER — Encounter: Payer: Self-pay | Admitting: Nurse Practitioner

## 2020-03-25 ENCOUNTER — Ambulatory Visit (INDEPENDENT_AMBULATORY_CARE_PROVIDER_SITE_OTHER): Payer: 59 | Admitting: Nurse Practitioner

## 2020-03-25 ENCOUNTER — Other Ambulatory Visit: Payer: Self-pay

## 2020-03-25 VITALS — BP 121/81 | HR 78 | Temp 98.2°F | Resp 20 | Ht 69.0 in | Wt 229.0 lb

## 2020-03-25 DIAGNOSIS — I10 Essential (primary) hypertension: Secondary | ICD-10-CM | POA: Diagnosis not present

## 2020-03-25 DIAGNOSIS — Z6834 Body mass index (BMI) 34.0-34.9, adult: Secondary | ICD-10-CM

## 2020-03-25 DIAGNOSIS — E291 Testicular hypofunction: Secondary | ICD-10-CM

## 2020-03-25 DIAGNOSIS — K047 Periapical abscess without sinus: Secondary | ICD-10-CM | POA: Diagnosis not present

## 2020-03-25 DIAGNOSIS — F5101 Primary insomnia: Secondary | ICD-10-CM | POA: Diagnosis not present

## 2020-03-25 DIAGNOSIS — R5383 Other fatigue: Secondary | ICD-10-CM

## 2020-03-25 MED ORDER — LISINOPRIL 10 MG PO TABS: 10.0000 mg | ORAL_TABLET | Freq: Every day | ORAL | 1 refills | Status: DC

## 2020-03-25 MED ORDER — CLINDAMYCIN HCL 300 MG PO CAPS: 300.0000 mg | ORAL_CAPSULE | Freq: Three times a day (TID) | ORAL | 0 refills | Status: DC

## 2020-03-25 MED ORDER — TRAZODONE HCL 300 MG PO TABS: 300.0000 mg | ORAL_TABLET | Freq: Every day | ORAL | 1 refills | Status: DC

## 2020-03-25 NOTE — Progress Notes (Signed)
Subjective:    Patient ID: Vincent [name], male    DOB: October 12, 1985, 34 y.o.   MRN: [id number]   Chief Complaint: Medical Management of Chronic Issues    HPI:  1. Essential hypertension No c/o chest pain, sob or headache. Does not check blood pressure at home. BP Readings from Last 3 Encounters:  09/23/19 132/71  08/26/18 137/88  08/02/18 (!) 143/76     2. Primary insomnia He is on trazadone nightly to sleep. Sleeps pretty wll at  Night. Feels rested in mornings. Sleeps 5 hours a night.  3. BMI 34.0-34.9,adult No recent weight changes. Wt Readings from Last 3 Encounters:  03/25/20 229 lb (103.9 kg)  09/23/19 231 lb (104.8 kg)  08/26/18 220 lb (99.8 kg)   BMI Readings from Last 3 Encounters:  03/25/20 33.82 kg/m  09/23/19 34.11 kg/m  08/26/18 32.49 kg/m       Outpatient Encounter Medications as of 03/25/2020  Medication Sig  . albuterol (PROAIR HFA) 108 (90 Base) MCG/ACT inhaler Inhale 2 puffs into the lungs every 6 (six) hours as needed for wheezing or shortness of breath. (Needs to be seen before next refill)  . lisinopril (ZESTRIL) 10 MG tablet Take 1 tablet (10 mg total) by mouth daily.  . trazodone (DESYREL) 300 MG tablet Take 1 tablet (300 mg total) by mouth at bedtime.     Past Surgical History:  Procedure Laterality Date  . MANDIBLE SURGERY  2009  . VASECTOMY  2007    Family History  Family history unknown: Yes    New complaints: Abscess tooth in blower back jaw  Social history: Lives with wife and daughter. Works night shift  Controlled substance contract: n/a    Review of Systems  Constitutional: Negative for diaphoresis.  Eyes: Negative for pain.  Respiratory: Negative for shortness of breath.   Cardiovascular: Negative for chest pain, palpitations and leg swelling.  Gastrointestinal: Negative for abdominal pain.  Endocrine: Negative for polydipsia.  Skin: Negative for rash.  Neurological: Negative for dizziness, weakness and  headaches.  Hematological: Does not bruise/bleed easily.  All other systems reviewed and are negative.      Objective:   Physical Exam Vitals and nursing note reviewed.  Constitutional:      Appearance: Normal appearance. He is well-developed.  HENT:     Head: Normocephalic.     Nose: Nose normal.  Eyes:     Pupils: Pupils are equal, round, and reactive to light.  Neck:     Thyroid: No thyroid mass or thyromegaly.     Vascular: No carotid bruit or JVD.     Trachea: Phonation normal.  Cardiovascular:     Rate and Rhythm: Normal rate and regular rhythm.  Pulmonary:     Effort: Pulmonary effort is normal. No respiratory distress.     Breath sounds: Normal breath sounds.  Abdominal:     General: Bowel sounds are normal.     Palpations: Abdomen is soft.     Tenderness: There is no abdominal tenderness.  Musculoskeletal:        General: Normal range of motion.     Cervical back: Normal range of motion and neck supple.  Lymphadenopathy:     Cervical: No cervical adenopathy.  Skin:    General: Skin is warm and dry.  Neurological:     Mental Status: He is alert and oriented to person, place, and time.  Psychiatric:        Behavior: Behavior normal.  Thought Content: Thought content normal.        Judgment: Judgment normal.    BP 121/81   Pulse 78   Temp 98.2 F (36.8 C) (Temporal)   Resp 20   Ht '5\' 9"'  (1.753 m)   Wt 229 lb (103.9 kg)   SpO2 97%   BMI 33.82 kg/m        Assessment & Plan:  QUINTEL MCCALLA comes in today with chief complaint of Medical Management of Chronic Issues   Diagnosis and orders addressed:  1. Essential hypertension Low sodium diet - lisinopril (ZESTRIL) 10 MG tablet; Take 1 tablet (10 mg total) by mouth daily.  Dispense: 90 tablet; Refill: 1 - CBC with Differential/Platelet - CMP14+EGFR - Lipid panel  2. Primary insomnia Bedtime routine - trazodone (DESYREL) 300 MG tablet; Take 1 tablet (300 mg total) by mouth at bedtime.   Dispense: 90 tablet; Refill: 1  3. BMI 34.0-34.9,adult Discussed diet and exercise for person with BMI >25 Will recheck weight in 3-6 months  4. Abscessed tooth Keep dental appointment - clindamycin (CLEOCIN) 300 MG capsule; Take 1 capsule (300 mg total) by mouth 3 (three) times daily.  Dispense: 30 capsule; Refill: 0   Labs pending Health Maintenance reviewed Diet and exercise encouraged  Follow up plan: 6 months   Mary-Margaret Hassell Done, FNP

## 2020-03-25 NOTE — Patient Instructions (Signed)
Dental Abscess  A dental abscess is a collection of pus in or around a tooth that results from an infection. An abscess can cause pain in the affected area as well as other symptoms. Treatment is important to help with symptoms and to prevent the infection from spreading. What are the causes? This condition is caused by a bacterial infection around the root of the tooth that involves the inner part of the tooth (pulp). It may result from:  Severe tooth decay.  Trauma to the tooth, such as a broken or chipped tooth, that allows bacteria to enter into the pulp.  Severe gum disease around a tooth. What increases the risk? This condition is more likely to develop in males. It is also more likely to develop in people who:  Have dental decay (cavities).  Eat sugary snacks between meals.  Use tobacco products.  Have diabetes.  Have a weakened disease-fighting system (immune system).  Do not brush and care for their teeth regularly. What are the signs or symptoms? Symptoms of this condition include:  Severe pain in and around the infected tooth.  Swelling and redness around the infected tooth, in the mouth, or in the face.  Tenderness.  Pus drainage.  Bad breath.  Bitter taste in the mouth.  Difficulty swallowing.  Difficulty opening the mouth.  Nausea.  Vomiting.  Chills.  Swollen neck glands.  Fever. How is this diagnosed? This condition is diagnosed based on:  Your symptoms and your medical and dental history.  An examination of the infected tooth. During the exam, your dentist may tap on the infected tooth. You may also have X-rays of the affected area. How is this treated? This condition is treated by getting rid of the infection. This may be done with:  Incision and drainage. This procedure is done by making an incision in the abscess to drain out the pus. Removing pus is the first priority in treating an abscess.  Antibiotic medicines. These may be used  in certain situations.  Antibacterial mouth rinse.  A root canal. This may be performed to save the tooth. Your dentist accesses the visible part of your tooth (crown) with a drill and removes any damaged pulp. Then the space is filled and sealed off.  Tooth extraction. The tooth is pulled out if it cannot be saved by other treatment. You may also receive treatment for pain, such as:  Acetaminophen or NSAIDs.  Gels that contain a numbing medicine.  An injection to block the pain near your nerve. Follow these instructions at home: Medicines  Take over-the-counter and prescription medicines only as told by your dentist.  If you were prescribed an antibiotic, take it as told by your dentist. Do not stop taking the antibiotic even if you start to feel better.  If you were prescribed a gel that contains a numbing medicine, use it exactly as told in the directions. Do not use these gels for children who are younger than 2 years of age.  Do not drive or use heavy machinery while taking prescription pain medicine. General instructions  Rinse out your mouth often with salt water to relieve pain or swelling. To make a salt-water mixture, completely dissolve -1 tsp of salt in 1 cup of warm water.  Eat a soft diet while your abscess is healing.  Drink enough fluid to keep your urine pale yellow.  Do not apply heat to the outside of your mouth.  Do not use any products that contain nicotine or   tobacco, such as cigarettes and e-cigarettes. If you need help quitting, ask your health care provider.  Keep all follow-up visits as told by your dentist. This is important. How is this prevented?  Brush your teeth every morning and night with fluoride toothpaste. Floss one time each day.  Get regularly scheduled dental cleanings.  Consider having a dental sealant applied on teeth that have deep holes (caries).  Drink fluoridated water regularly. This includes most tap water. Check the label  on bottled water to see if it contains fluoride.  Drink water instead of sugary drinks.  Eat healthy meals and snacks.  Wear a mouth guard or face shield to protect your teeth while playing sports. Contact a health care provider if:  Your pain is worse and is not helped by medicine. Get help right away if:  You have a fever or chills.  Your symptoms suddenly get worse.  You have a very bad headache.  You have problems breathing or swallowing.  You have trouble opening your mouth.  You have swelling in your neck or around your eye. Summary  A dental abscess is a collection of pus in or around a tooth that results from an infection.  A dental abscess may result from severe tooth decay, trauma to the tooth, or severe gum disease around a tooth.  Symptoms include severe pain, swelling, redness, and drainage of pus in and around the infected tooth.  The first priority in treating a dental abscess is to drain out the pus. Treatment may also involve removing damage inside the tooth (root canal) or pulling out (extracting) the tooth. This information is not intended to replace advice given to you by your health care provider. Make sure you discuss any questions you have with your health care provider. Document Revised: 08/23/2017 Document Reviewed: 05/13/2017 Elsevier Patient Education  2020 Elsevier Inc.  

## 2020-04-01 ENCOUNTER — Telehealth: Payer: Self-pay | Admitting: *Deleted

## 2020-04-01 NOTE — Telephone Encounter (Signed)
Patient is aware to come by for blood work.

## 2020-04-01 NOTE — Telephone Encounter (Signed)
-----   Message from [facility], FNP sent at 03/31/2020  1:23 PM EDT ----- patinet needs to come back to office and get his labs drawn- cannot get meds in futire without it.

## 2020-04-08 ENCOUNTER — Other Ambulatory Visit: Payer: 59

## 2020-04-08 ENCOUNTER — Other Ambulatory Visit: Payer: Self-pay

## 2020-04-08 DIAGNOSIS — R5383 Other fatigue: Secondary | ICD-10-CM

## 2020-04-08 NOTE — Addendum Note (Signed)
Addended by: Prescott Gum on: 04/08/2020 08:48 AM   Modules accepted: Orders

## 2020-04-11 ENCOUNTER — Telehealth: Payer: Self-pay | Admitting: *Deleted

## 2020-04-11 MED ORDER — TESTOSTERONE 20.25 MG/ACT (1.62%) TD GEL: 1.0000 | Freq: Every day | TRANSDERMAL | 5 refills | Status: DC

## 2020-04-11 NOTE — Addendum Note (Signed)
Addended by: Bennie Pierini on: 04/11/2020 11:13 AM   Modules accepted: Orders

## 2020-04-11 NOTE — Telephone Encounter (Signed)
LM for pt to call back   As I entered PA in cover my meds - it states that it can not "find pt" with the mem ID and insurance numbers that i'm entering.   LM for pt to ask for me when he calls - so that we can clairify.

## 2020-04-12 NOTE — Telephone Encounter (Signed)
Called pt back - aware sent to plan - and still waiting

## 2020-04-12 NOTE — Telephone Encounter (Signed)
PA started today after pt called with correct BIN information.  Key: [id number] Sent to plan   Pt aware that sent to plan

## 2020-04-12 NOTE — Telephone Encounter (Signed)
Pt asked to talk to Berton Lan please call back

## 2020-04-13 LAB — CMP14+EGFR
ALT: 27 IU/L (ref 0–44)
AST: 21 IU/L (ref 0–40)
Albumin/Globulin Ratio: 2.1 (ref 1.2–2.2)
Albumin: 4.5 g/dL (ref 4.0–5.0)
Alkaline Phosphatase: 58 IU/L (ref 48–121)
BUN/Creatinine Ratio: 20 (ref 9–20)
BUN: 21 mg/dL — ABNORMAL HIGH (ref 6–20)
Bilirubin Total: 0.2 mg/dL (ref 0.0–1.2)
CO2: 25 mmol/L (ref 20–29)
Calcium: 9.5 mg/dL (ref 8.7–10.2)
Chloride: 104 mmol/L (ref 96–106)
Creatinine, Ser: 1.07 mg/dL (ref 0.76–1.27)
GFR calc Af Amer: 103 mL/min/{1.73_m2} (ref >59)
GFR calc non Af Amer: 89 mL/min/{1.73_m2} (ref >59)
Globulin, Total: 2.1 g/dL (ref 1.5–4.5)
Glucose: 104 mg/dL — ABNORMAL HIGH (ref 65–99)
Potassium: 4.9 mmol/L (ref 3.5–5.2)
Sodium: 142 mmol/L (ref 134–144)
Total Protein: 6.6 g/dL (ref 6.0–8.5)

## 2020-04-13 LAB — CBC WITH DIFFERENTIAL/PLATELET
Basophils Absolute: 0 10*3/uL (ref 0.0–0.2)
Basos: 0 %
EOS (ABSOLUTE): 0.2 10*3/uL (ref 0.0–0.4)
Eos: 4 %
Hematocrit: 40.9 % (ref 37.5–51.0)
Hemoglobin: 13.3 g/dL (ref 13.0–17.7)
Immature Grans (Abs): 0 10*3/uL (ref 0.0–0.1)
Immature Granulocytes: 0 %
Lymphocytes Absolute: 2.8 10*3/uL (ref 0.7–3.1)
Lymphs: 42 %
MCH: 28.9 pg (ref 26.6–33.0)
MCHC: 32.5 g/dL (ref 31.5–35.7)
MCV: 89 fL (ref 79–97)
Monocytes Absolute: 0.7 10*3/uL (ref 0.1–0.9)
Monocytes: 10 %
Neutrophils Absolute: 3 10*3/uL (ref 1.4–7.0)
Neutrophils: 44 %
Platelets: 263 10*3/uL (ref 150–450)
RBC: 4.6 x10E6/uL (ref 4.14–5.80)
RDW: 14.3 % (ref 11.6–15.4)
WBC: 6.7 10*3/uL (ref 3.4–10.8)

## 2020-04-13 LAB — LIPID PANEL
Chol/HDL Ratio: 4.9 ratio (ref 0.0–5.0)
Cholesterol, Total: 254 mg/dL — ABNORMAL HIGH (ref 100–199)
HDL: 52 mg/dL (ref >39)
LDL Chol Calc (NIH): 149 mg/dL — ABNORMAL HIGH (ref 0–99)
Triglycerides: 292 mg/dL — ABNORMAL HIGH (ref 0–149)
VLDL Cholesterol Cal: 53 mg/dL — ABNORMAL HIGH (ref 5–40)

## 2020-04-13 LAB — VITAMIN D 25 HYDROXY (VIT D DEFICIENCY, FRACTURES): Vit D, 25-Hydroxy: 35 ng/mL (ref 30.0–100.0)

## 2020-04-13 LAB — TESTOSTERONE,FREE AND TOTAL
Testosterone, Free: 6.8 pg/mL — ABNORMAL LOW (ref 8.7–25.1)
Testosterone: 163 ng/dL — ABNORMAL LOW (ref 264–916)

## 2020-04-13 NOTE — Telephone Encounter (Signed)
Vincent [name] - PA Case ID: [id number]  Questions answered and sent to plan

## 2020-04-13 NOTE — Telephone Encounter (Signed)
Case [id number] Status:Denied - ANDROGEL Can pt try something else - PA does not give me recommendations for alternatives.

## 2020-04-18 MED ORDER — TESTOSTERONE 1.62 % TD GEL: TRANSDERMAL | 2 refills | Status: DC

## 2020-04-18 NOTE — Addendum Note (Signed)
Addended by: Bennie Pierini on: 04/18/2020 11:06 AM   Modules accepted: Orders

## 2020-04-25 ENCOUNTER — Telehealth: Payer: Self-pay | Admitting: Nurse Practitioner

## 2020-04-25 MED ORDER — BENZONATATE 100 MG PO CAPS
100.0000 mg | ORAL_CAPSULE | Freq: Three times a day (TID) | ORAL | 0 refills | Status: DC | PRN
Start: 2020-04-25 — End: 2020-11-21

## 2020-04-25 NOTE — Telephone Encounter (Signed)
Pt wife aware

## 2020-04-25 NOTE — Telephone Encounter (Signed)
Tessalon perle prescription sent to pharmacy

## 2020-04-25 NOTE — Telephone Encounter (Signed)
  Incoming Patient Call  04/25/2020  What symptoms do you have? Cough congestion  How long have you been sick? Three days  Have you been seen for this problem? No, pt would like something for cough sent to pharmacy tested Positive for COVID-19  If your provider decides to give you a prescription, which pharmacy would you like for it to be sent to? [facility]    Patient informed that this information will be sent to the clinical staff for review and that they should receive a follow up call.

## 2020-05-02 ENCOUNTER — Telehealth: Payer: Self-pay | Admitting: Nurse Practitioner

## 2020-05-02 NOTE — Telephone Encounter (Signed)
lmtcb to see when symptoms started and what exact day he tested positive.

## 2020-05-02 NOTE — Telephone Encounter (Signed)
Pt called stating that he tested positive for COVID last week and says that his work told him that as long as he is not having any symptoms, he is ok to return to work. Pt says that all of his symptoms have went away. Wants to make sure it is ok for him to go back to work.

## 2020-05-02 NOTE — Telephone Encounter (Signed)
Pt Started coughing 04/24/20 and tested positive 04/25/20 with rapid test from [facility] but then was told by employers to get tested [facility]25[facility] which came back negative. He had cough, headache, sore throat but hasn't had any symptoms except his temp was 100.1 this morning. When should he return to work?

## 2020-05-03 ENCOUNTER — Encounter: Payer: Self-pay | Admitting: Nurse Practitioner

## 2020-05-03 NOTE — Telephone Encounter (Signed)
Ok to write letter to return to work on [employer]

## 2020-05-04 ENCOUNTER — Encounter: Payer: Self-pay | Admitting: Nurse Practitioner

## 2020-05-04 NOTE — Telephone Encounter (Signed)
Pt aware - letter up front 

## 2020-05-13 ENCOUNTER — Other Ambulatory Visit: Payer: Self-pay | Admitting: Nurse Practitioner

## 2020-05-13 DIAGNOSIS — J4520 Mild intermittent asthma, uncomplicated: Secondary | ICD-10-CM

## 2020-09-27 ENCOUNTER — Ambulatory Visit: Payer: Self-pay | Admitting: Nurse Practitioner

## 2020-10-11 ENCOUNTER — Encounter: Payer: 59 | Admitting: Nurse Practitioner

## 2020-10-16 ENCOUNTER — Other Ambulatory Visit: Payer: Self-pay | Admitting: Nurse Practitioner

## 2020-10-18 ENCOUNTER — Telehealth: Payer: Self-pay | Admitting: *Deleted

## 2020-10-18 NOTE — Telephone Encounter (Signed)
PA in process  Key: [id number] - PA Case ID: [id number] Need help? Call us at [phone] Status Sent to [employer] Testosterone 20.25 MG/ACT(1.62%) gel Form [employer] Electronic PA Form (2017 NCPDP)   Status:Approved;Review Type:Prior Auth;Coverage Start Date:09/18/2020;Coverage End Date:10/18/2021;  Pt and pharmacy aware.

## 2020-10-18 NOTE — Telephone Encounter (Signed)
Rc for nurse 

## 2020-10-18 NOTE — Telephone Encounter (Signed)
Patient just wanted to let us know he is going to be reaching back out to insurance and will give [place] an update tomorrow.

## 2020-10-18 NOTE — Telephone Encounter (Signed)
Patient called and asked to call insurance as we are getting a notification that patient can not be verified when trying to complete PA. Patient will call insurance and give [place] a call back.

## 2020-10-19 ENCOUNTER — Encounter: Payer: 59 | Admitting: Family Medicine

## 2020-10-19 ENCOUNTER — Encounter: Payer: Self-pay | Admitting: Family Medicine

## 2020-10-19 NOTE — Progress Notes (Signed)
Attempted to call patient multiple times and have been unable to reach him.  I did leave a voicemail and have not received a call back.  I am therefore closing the chart.

## 2020-10-20 ENCOUNTER — Ambulatory Visit (INDEPENDENT_AMBULATORY_CARE_PROVIDER_SITE_OTHER): Payer: 59 | Admitting: Nurse Practitioner

## 2020-10-20 DIAGNOSIS — J0110 Acute frontal sinusitis, unspecified: Secondary | ICD-10-CM

## 2020-10-20 MED ORDER — AZITHROMYCIN 250 MG PO TABS
ORAL_TABLET | ORAL | 0 refills | Status: DC
Start: 2020-10-20 — End: 2020-11-21

## 2020-10-20 MED ORDER — PREDNISONE 10 MG (21) PO TBPK
ORAL_TABLET | ORAL | 0 refills | Status: DC
Start: 2020-10-20 — End: 2020-11-21

## 2020-10-20 MED ORDER — DM-GUAIFENESIN ER 30-600 MG PO TB12
1.0000 | ORAL_TABLET | Freq: Two times a day (BID) | ORAL | 0 refills | Status: DC
Start: 2020-10-20 — End: 2020-11-21

## 2020-10-21 ENCOUNTER — Encounter: Payer: Self-pay | Admitting: Nurse Practitioner

## 2020-10-21 DIAGNOSIS — J0110 Acute frontal sinusitis, unspecified: Secondary | ICD-10-CM | POA: Insufficient documentation

## 2020-10-21 NOTE — Progress Notes (Signed)
   Virtual Visit via telephone Note Due to COVID-19 pandemic this visit was conducted virtually. This visit type was conducted due to national recommendations for restrictions regarding the COVID-19 Pandemic (e.g. social distancing, sheltering in place) in an effort to limit this patient's exposure and mitigate transmission in our community. All issues noted in this document were discussed and addressed.  A physical exam was not performed with this format.  I connected with Vincent [name] on 10/21/20 at  4:03 PM by telephone and verified that I am speaking with the correct person using two identifiers. Vincent [name] is currently located at home during visit. The provider, Daryll Drown, NP is located in their office at time of visit.  I discussed the limitations, risks, security and privacy concerns of performing an evaluation and management service by telephone and the availability of in person appointments. I also discussed with the patient that there may be a patient responsible charge related to this service. The patient expressed understanding and agreed to proceed.   History and Present Illness:  Sinusitis This is a recurrent problem. The current episode started in the past 7 days. The problem has been gradually worsening since onset. There has been no fever. The pain is moderate. Associated symptoms include congestion, coughing, headaches and sinus pressure. Pertinent negatives include no chills. Past treatments include nothing. The treatment provided no relief.      Review of Systems  Constitutional: Negative for chills and fever.  HENT: Positive for congestion and sinus pressure.   Respiratory: Positive for cough.   Cardiovascular: Negative.   Genitourinary: Negative.   Musculoskeletal: Negative.   Skin: Negative.   Neurological: Positive for headaches.  All other systems reviewed and are negative.    Observations/Objective: Televisit-patient does not sound in  distress.  Assessment and Plan:  Subacute frontal sinusitis Symptoms not well controlled and worsening in the last few days, this is not new for patient.  Recent COVID-19 test negative.  Started patient on Z-Pak, prednisone taper, guaifenesin for cough and congestion.  Follow-up with worsening or unresolved symptoms.  Rx sent to pharmacy.  Follow Up Instructions:  Follow-up with worsening unresolved symptoms.    I discussed the assessment and treatment plan with the patient. The patient was provided an opportunity to ask questions and all were answered. The patient agreed with the plan and demonstrated an understanding of the instructions.   The patient was advised to call back or seek an in-person evaluation if the symptoms worsen or if the condition fails to improve as anticipated.  The above assessment and management plan was discussed with the patient. The patient verbalized understanding of and has agreed to the management plan. Patient is aware to call the clinic if symptoms persist or worsen. Patient is aware when to return to the clinic for a follow-up visit. Patient educated on when it is appropriate to go to the emergency department.   Time call ended:  04:14 pm  I provided 11 minutes of non-face-to-face time during this encounter.    Daryll Drown, NP

## 2020-10-21 NOTE — Assessment & Plan Note (Signed)
Symptoms not well controlled and worsening in the last few days, this is not new for patient.  Recent COVID-19 test negative.  Started patient on Z-Pak, prednisone taper, guaifenesin for cough and congestion.  Follow-up with worsening or unresolved symptoms.  Rx sent to pharmacy.

## 2020-10-24 ENCOUNTER — Encounter: Payer: 59 | Admitting: Nurse Practitioner

## 2020-10-26 ENCOUNTER — Other Ambulatory Visit: Payer: Self-pay | Admitting: Nurse Practitioner

## 2020-10-26 DIAGNOSIS — I10 Essential (primary) hypertension: Secondary | ICD-10-CM

## 2020-11-01 ENCOUNTER — Other Ambulatory Visit: Payer: Self-pay | Admitting: Nurse Practitioner

## 2020-11-01 DIAGNOSIS — F5101 Primary insomnia: Secondary | ICD-10-CM

## 2020-11-21 ENCOUNTER — Encounter: Payer: Self-pay | Admitting: Nurse Practitioner

## 2020-11-21 ENCOUNTER — Other Ambulatory Visit: Payer: Self-pay

## 2020-11-21 ENCOUNTER — Ambulatory Visit (INDEPENDENT_AMBULATORY_CARE_PROVIDER_SITE_OTHER): Payer: 59 | Admitting: Nurse Practitioner

## 2020-11-21 VITALS — BP 138/74 | HR 78 | Temp 99.6°F | Resp 20 | Ht 69.0 in | Wt 240.0 lb

## 2020-11-21 DIAGNOSIS — E291 Testicular hypofunction: Secondary | ICD-10-CM | POA: Insufficient documentation

## 2020-11-21 DIAGNOSIS — I10 Essential (primary) hypertension: Secondary | ICD-10-CM

## 2020-11-21 DIAGNOSIS — R5383 Other fatigue: Secondary | ICD-10-CM | POA: Insufficient documentation

## 2020-11-21 DIAGNOSIS — M25512 Pain in left shoulder: Secondary | ICD-10-CM

## 2020-11-21 DIAGNOSIS — Z6834 Body mass index (BMI) 34.0-34.9, adult: Secondary | ICD-10-CM

## 2020-11-21 DIAGNOSIS — F5101 Primary insomnia: Secondary | ICD-10-CM

## 2020-11-21 MED ORDER — TRAZODONE HCL 300 MG PO TABS
ORAL_TABLET | ORAL | 1 refills | Status: DC
Start: 2020-11-21 — End: 2021-06-15

## 2020-11-21 MED ORDER — LISINOPRIL 10 MG PO TABS
10.0000 mg | ORAL_TABLET | Freq: Every day | ORAL | 1 refills | Status: DC
Start: 2020-11-21 — End: 2021-05-22

## 2020-11-21 MED ORDER — TESTOSTERONE 20.25 MG/ACT (1.62%) TD GEL: TRANSDERMAL | 2 refills | Status: DC

## 2020-11-21 NOTE — Progress Notes (Signed)
 Subjective:    Patient ID: Vincent [name], male    DOB: 11/20/1985, 35 y.o.   MRN: [id number]   Chief Complaint: Medical Management of Chronic Issues (Wants an anti inflammatory med. He has been seeing a chiropractor and wants to discuss testosterone)    HPI:  1. Essential hypertension Taking lisinopril and tolerating well. Checks at home. Runs 120-125 systolic.   Wt Readings from Last 3 Encounters:  11/21/20 240 lb (108.9 kg)  03/25/20 229 lb (103.9 kg)  09/23/19 231 lb (104.8 kg)     2. Hypogonadism in male Sex drive is feeling better since testosterone therapy. But he has been feeling extremely fatigued. Takes testosterone waking in the afternoon, he works night shift.   3. Fatigue, unspecified type Believes he snores. He began working nights October 2020. His fatigue has become more pronounced over the last year and a half or so. He even feels fatigued after 8hr of sleep. Not able to exercise because of his work schedule.   4. BMI 34.0-34.9,adult Noticed a weight gain recently. He believes it may from working night shift.   Wt Readings from Last 3 Encounters:  11/21/20 240 lb (108.9 kg)  03/25/20 229 lb (103.9 kg)  09/23/19 231 lb (104.8 kg)     5. Primary insomnia Taking trazadone, 300mg qhs.   6. Left shoulder pain Consider using OTC NSAID's, warm compress, OTC topical pain relief gel (like biogel) for acute pain relief. Continue to follow up with your chiropractor if this helps with symptom management.    Outpatient Encounter Medications as of 11/21/2020  Medication Sig  . albuterol (VENTOLIN HFA) 108 (90 Base) MCG/ACT inhaler INHALE 2 PUFFS BY MOUTH EVERY 6 HOURS AS NEEDED FOR WHEEZING OR SHORTNESS OF BREATH  . lisinopril (ZESTRIL) 10 MG tablet Take 1 tablet (10 mg total) by mouth daily. (Needs to be seen before next refill)  . Testosterone 20.25 MG/ACT (1.62%) GEL APPLY TO SHOULDER AREA DAILY- MAKE SURE DRIES WELL AND NO ONE ELSE TOUCHES AREA  . trazodone  (DESYREL) 300 MG tablet TAKE 1 TABLET BY MOUTH EVERYDAY AT BEDTIME  . [DISCONTINUED] azithromycin (ZITHROMAX) 250 MG tablet 2 tablet day 1, 1 tablet day 2-5.  . [DISCONTINUED] benzonatate (TESSALON PERLES) 100 MG capsule Take 1 capsule (100 mg total) by mouth 3 (three) times daily as needed for cough.  . [DISCONTINUED] clindamycin (CLEOCIN) 300 MG capsule Take 1 capsule (300 mg total) by mouth 3 (three) times daily.  . [DISCONTINUED] dextromethorphan-guaiFENesin (MUCINEX DM) 30-600 MG 12hr tablet Take 1 tablet by mouth 2 (two) times daily.  . [DISCONTINUED] predniSONE (STERAPRED UNI-PAK 21 TAB) 10 MG (21) TBPK tablet 6 tablet day 1, 5 tablet day 2, 4 tablet day 3, 3 tablet day 4, 2 tablet day 5, 1 tablet day 6   No facility-administered encounter medications on file as of 11/21/2020.    Past Surgical History:  Procedure Laterality Date  . MANDIBLE SURGERY  2009  . VASECTOMY  2007    Family History  Family history unknown: Yes    New complaints: Left shoulder pain; seeing chiropractor for management. Has taken 800mg ibuprofen  Social history: Lives with wife and daughter.   Controlled substance contract: n/a     Review of Systems  Constitutional: Positive for fatigue. Negative for chills and fever.  Respiratory: Negative for cough, shortness of breath and wheezing.   Cardiovascular: Negative for chest pain and palpitations.  Gastrointestinal: Negative for abdominal distention, abdominal pain, constipation and diarrhea.  Genitourinary:   Negative for difficulty urinating, dysuria and hematuria.  Musculoskeletal: Positive for back pain.  Neurological: Negative for dizziness, seizures, syncope and numbness.  Psychiatric/Behavioral: Negative for agitation and confusion. The patient is not nervous/anxious.        Objective:   Physical Exam Constitutional:      Appearance: Normal appearance.  Eyes:     Extraocular Movements: Extraocular movements intact.  Neck:     Vascular:  No carotid bruit.  Cardiovascular:     Rate and Rhythm: Normal rate and regular rhythm.     Pulses: Normal pulses.     Heart sounds: Normal heart sounds.  Pulmonary:     Effort: Pulmonary effort is normal.     Breath sounds: Normal breath sounds.  Abdominal:     General: Bowel sounds are normal.     Palpations: Abdomen is soft.  Musculoskeletal:        General: Normal range of motion.     Cervical back: Normal range of motion and neck supple.  Skin:    General: Skin is warm and dry.     Capillary Refill: Capillary refill takes less than 2 seconds.  Neurological:     Mental Status: He is alert and oriented to person, place, and time.  Psychiatric:        Mood and Affect: Mood normal.        Behavior: Behavior normal.        Thought Content: Thought content normal.        Judgment: Judgment normal.    Vitals:   11/21/20 1513  BP: 138/74  Pulse: 78  Resp: 20  Temp: 99.6 F (37.6 C)  SpO2: 95%       Assessment & Plan:   Treyvin M Mccleave comes in today with chief complaint of Medical Management of Chronic Issues (Wants an anti inflammatory med. He has been seeing a chiropractor and wants to discuss testosterone)   Diagnosis and orders addressed:  1. Essential hypertension Low sodium diet, keep monitoring BP at home; continue medication as prescribed.   - CBC with Differential/Platelet; Future - CMP14+EGFR; Future - Lipid panel; Future - lisinopril (ZESTRIL) 10 MG tablet; Take 1 tablet (10 mg total) by mouth daily. (Needs to be seen before next refill)  Dispense: 90 tablet; Refill: 1  2. Hypogonadism in male Check testosterone levels, follow up per lab work  - Testosterone,Free and Total; Future - Testosterone 20.25 MG/ACT (1.62%) GEL; APPLY TO SHOULDER AREA DAILY- MAKE SURE DRIES WELL AND NO ONE ELSE TOUCHES AREA  Dispense: 75 g; Refill: 2  3. Fatigue, unspecified type  If possible, consider alternating work schedule from nights to days. This may help relieve  some symptoms of fatigue, weight gain.   - Thyroid Panel With TSH; Future  4. BMI 34.0-34.9,adult Incorporate more fruits and vegetables in daily diet.   5. Primary insomnia Continue taking medications as prescribed.- trazodone (DESYREL) 300 MG tablet; TAKE 1 TABLET BY MOUTH EVERYDAY AT BEDTIME  Dispense: 90 tablet; Refill: 1   Labs pending Health Maintenance reviewed Diet and exercise encouraged  Follow up plan: PRN  Mary-Margaret Martin, FNP  

## 2020-11-21 NOTE — Patient Instructions (Signed)

## 2020-12-09 ENCOUNTER — Encounter: Payer: Self-pay | Admitting: Family Medicine

## 2020-12-09 ENCOUNTER — Telehealth (INDEPENDENT_AMBULATORY_CARE_PROVIDER_SITE_OTHER): Payer: 59 | Admitting: Family Medicine

## 2020-12-09 DIAGNOSIS — M542 Cervicalgia: Secondary | ICD-10-CM | POA: Diagnosis not present

## 2020-12-09 MED ORDER — DICLOFENAC SODIUM 75 MG PO TBEC: 75.0000 mg | DELAYED_RELEASE_TABLET | Freq: Two times a day (BID) | ORAL | 0 refills | Status: DC

## 2020-12-09 MED ORDER — METHOCARBAMOL 500 MG PO TABS: 500.0000 mg | ORAL_TABLET | Freq: Three times a day (TID) | ORAL | 0 refills | Status: DC | PRN

## 2020-12-09 NOTE — Progress Notes (Signed)
Virtual Visit via Video note  I connected with [name] on 12/09/20 at 9:06 AM by video and verified that I am speaking with the correct person using two identifiers. Vincent [name] is currently located in his truck and nobody is currently with him during visit. The provider, Gwenlyn Fudge, FNP is located in their home at time of visit.  I discussed the limitations, risks, security and privacy concerns of performing an evaluation and management service by video and the availability of in person appointments. I also discussed with the patient that there may be a patient responsible charge related to this service. The patient expressed understanding and agreed to proceed.  Subjective: PCP: Bennie Pierini, FNP  Chief Complaint  Patient presents with  . Neck Pain   Patient reports stiffness in his neck that he woke up with 4 days ago.  He has also been having muscle spasms in his neck for the past 2 days.  He was previously going to the chiropractor for his back and shoulder so he had them do some stretching with his neck yesterday, but he reports this made his symptoms worse.  He has been applying Tiger Balm ointment and taking Motrin and Aleve.  His Motrin is 800 mg and he reports he took 5 tablets yesterday.  He also took 4 Aleve yesterday.  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  ROS: Per HPI  Current Outpatient Medications:  .  albuterol (VENTOLIN HFA) 108 (90 Base) MCG/ACT inhaler, INHALE 2 PUFFS BY MOUTH EVERY 6 HOURS AS NEEDED FOR WHEEZING OR SHORTNESS OF BREATH, Disp: 18 g, Rfl: 2 .  lisinopril (ZESTRIL) 10 MG tablet, Take 1 tablet (10 mg total) by mouth daily. (Needs to be seen before next refill), Disp: 90 tablet, Rfl: 1 .  Testosterone 20.25 MG/ACT (1.62%) GEL, APPLY TO SHOULDER AREA DAILY- MAKE SURE DRIES WELL AND NO ONE ELSE TOUCHES AREA, Disp: 75 g, Rfl: 2 .  trazodone (DESYREL) 300  MG tablet, TAKE 1 TABLET BY MOUTH EVERYDAY AT BEDTIME, Disp: 90 tablet, Rfl: 1  Allergies  Allergen Reactions  . Tigan [Trimethobenzamide Hcl]    Past Medical History:  Diagnosis Date  . ADHD (attention deficit hyperactivity disorder)   . Anxiety    panic   . Asthma     Observations/Objective: Physical Exam Constitutional:      General: He is not in acute distress.    Appearance: Normal appearance. He is not ill-appearing or toxic-appearing.  Eyes:     General: No scleral icterus.       Right eye: No discharge.        Left eye: No discharge.     Conjunctiva/sclera: Conjunctivae normal.  Pulmonary:     Effort: Pulmonary effort is normal. No respiratory distress.  Neurological:     Mental Status: He is alert and oriented to person, place, and time.  Psychiatric:        Mood and Affect: Mood normal.        Behavior: Behavior normal.        Thought Content: Thought content normal.        Judgment: Judgment normal.    Assessment and Plan: 1. Neck pain Advised not to take more than 1 NSAID at a time.  He is to stop using Motrin and Aleve and only take diclofenac twice daily.  Added Robaxin for the muscle spasms.  Encouraged application of heat.  He can continue his Tiger balm. - diclofenac (VOLTAREN) 75 MG EC tablet; Take 1 tablet (75 mg total) by mouth 2 (two) times daily.  Dispense: 60 tablet; Refill: 0 - methocarbamol (ROBAXIN) 500 MG tablet; Take 1 tablet (500 mg total) by mouth 3 (three) times daily as needed for muscle spasms.  Dispense: 60 tablet; Refill: 0   Follow Up Instructions:   I discussed the assessment and treatment plan with the patient. The patient was provided an opportunity to ask questions and all were answered. The patient agreed with the plan and demonstrated an understanding of the instructions.   The patient was advised to call back or seek an in-person evaluation if the symptoms worsen or if the condition fails to improve as anticipated.  The above  assessment and management plan was discussed with the patient. The patient verbalized understanding of and has agreed to the management plan. Patient is aware to call the clinic if symptoms persist or worsen. Patient is aware when to return to the clinic for a follow-up visit. Patient educated on when it is appropriate to go to the emergency department.   Time call ended: 9:17 AM  I provided 11 minutes of face-to-face time during this encounter.   [name], MSN, APRN, FNP-C [facility] 12/09/20

## 2021-01-14 ENCOUNTER — Other Ambulatory Visit: Payer: Self-pay | Admitting: Nurse Practitioner

## 2021-01-14 DIAGNOSIS — J4520 Mild intermittent asthma, uncomplicated: Secondary | ICD-10-CM

## 2021-01-27 ENCOUNTER — Other Ambulatory Visit: Payer: Self-pay | Admitting: Nurse Practitioner

## 2021-01-27 DIAGNOSIS — E291 Testicular hypofunction: Secondary | ICD-10-CM

## 2021-01-27 NOTE — Telephone Encounter (Signed)
Last office visit 11/21/20, testosterone was ordered but has not been completed, last level checked on 04/08/20  Last refill 11/21/20, 75 grams, 2 refills

## 2021-02-24 ENCOUNTER — Ambulatory Visit (INDEPENDENT_AMBULATORY_CARE_PROVIDER_SITE_OTHER): Payer: 59 | Admitting: Family Medicine

## 2021-02-24 ENCOUNTER — Encounter: Payer: Self-pay | Admitting: Family Medicine

## 2021-02-24 DIAGNOSIS — L237 Allergic contact dermatitis due to plants, except food: Secondary | ICD-10-CM | POA: Diagnosis not present

## 2021-02-24 MED ORDER — PREDNISONE 10 MG (21) PO TBPK: ORAL_TABLET | ORAL | 0 refills | Status: DC

## 2021-02-24 NOTE — Progress Notes (Signed)
   Virtual Visit  Note Due to COVID-19 pandemic this visit was conducted virtually. This visit type was conducted due to national recommendations for restrictions regarding the COVID-19 Pandemic (e.g. social distancing, sheltering in place) in an effort to limit this patient's exposure and mitigate transmission in our community. All issues noted in this document were discussed and addressed.  A physical exam was not performed with this format.  I connected with Vincent [name] on 02/24/21 at 0825 by telephone and verified that I am speaking with the correct person using two identifiers. Vincent [name] is currently located at work and no one is currently with him during the visit. The provider, Gabriel Earing, FNP is located in their office at time of visit.  I discussed the limitations, risks, security and privacy concerns of performing an evaluation and management service by telephone and the availability of in person appointments. I also discussed with the patient that there may be a patient responsible charge related to this service. The patient expressed understanding and agreed to proceed.  CC: poison oak  History and Present Illness:  HPI  Vincent [name] reports a rash from poison oak for 5 days. It started after doing yard work over the weekend. The rash is located on his arms and legs. He typically get a rash from poison oak yearly. He has been using calamine lotion but his rash has been getting worse. He works in a hot environment. He denies any signs of infection. He has not had any swelling of his mouth, lips, or throat. He denies shortness of breath or wheezing.    ROS As per HPI.   Observations/Objective: Alert and oriented x 3. Able to speak in full sentences without difficulty.   Assessment and Plan: Mandel was seen today for poison ivy.  Diagnoses and all orders for this visit:  Poison oak dermatitis Prednisone ordered as below. Continue calamine lotion. Antihistamines as  needed for itching. Follow up if no improvement, worsening symptoms, or signs of infection.  -     predniSONE (STERAPRED UNI-PAK 21 TAB) 10 MG (21) TBPK tablet; Use as directed on back of pill pack     Follow Up Instructions: As needed.     I discussed the assessment and treatment plan with the patient. The patient was provided an opportunity to ask questions and all were answered. The patient agreed with the plan and demonstrated an understanding of the instructions.   The patient was advised to call back or seek an in-person evaluation if the symptoms worsen or if the condition fails to improve as anticipated.  The above assessment and management plan was discussed with the patient. The patient verbalized understanding of and has agreed to the management plan. Patient is aware to call the clinic if symptoms persist or worsen. Patient is aware when to return to the clinic for a follow-up visit. Patient educated on when it is appropriate to go to the emergency department.   Time call ended: 0836   I provided 11 minutes of  non face-to-face time during this encounter.    Gabriel Earing, FNP

## 2021-04-04 ENCOUNTER — Encounter: Payer: Self-pay | Admitting: Physician Assistant

## 2021-04-04 ENCOUNTER — Other Ambulatory Visit: Payer: Self-pay

## 2021-04-04 ENCOUNTER — Ambulatory Visit (INDEPENDENT_AMBULATORY_CARE_PROVIDER_SITE_OTHER): Payer: 59 | Admitting: Physician Assistant

## 2021-04-04 VITALS — BP 131/82 | HR 61 | Temp 97.8°F | Resp 20 | Ht 69.0 in | Wt 208.1 lb

## 2021-04-04 DIAGNOSIS — M25511 Pain in right shoulder: Secondary | ICD-10-CM

## 2021-04-04 MED ORDER — MELOXICAM 15 MG PO TABS: 15.0000 mg | ORAL_TABLET | Freq: Every day | ORAL | 0 refills | Status: DC

## 2021-04-04 NOTE — Progress Notes (Signed)
  Subjective:     Patient ID: Vincent [name], male   DOB: Jun 08, 1986, 35 y.o.   MRN: [id number]  Shoulder Pain   Pt with R superior shoulder pain since restarting workout Sx are worse with bench press Denies any radiation of sx to the arm/neck Sx are aggravated by his work Used OTC Ibuprofen but no change in sx Used father's  Mobic and sx improved  Review of Systems  Constitutional: Negative.   Musculoskeletal:  Positive for arthralgias and myalgias. Negative for joint swelling, neck pain and neck stiffness.      Objective:   Physical Exam Vitals and nursing note reviewed.  Constitutional:      General: He is not in acute distress.    Appearance: Normal appearance. He is not ill-appearing or toxic-appearing.  Neurological:     Mental Status: He is alert.  FROM of the shoulder  SL sx with rest. Abduction + TTP over the [facility] joint No other TTP of the shoulder area FROM of the wrist/elbow w/o sx Good pulses/sensory Grip strength good/equal      Assessment:     1. Acute pain of right shoulder        Plan:     Discussed probably AC strain due to heavy bench press Rx for Mobic Heat/Ice Decrease pressing weight  Stretching Massage Nl course reviewed F/U prn

## 2021-04-04 NOTE — Patient Instructions (Signed)
Muscle Strain A muscle strain is an injury that occurs when a muscle is stretched beyond its normal length. Usually, a small number of muscle fibers are torn when this happens. There are three types of muscle strains. First-degree strains have the least amount of muscle fiber tearing and the least amount of pain.Second-degree and third-degree strains have more tearing and pain. Usually, recovery from muscle strain takes 1-2 weeks. Complete healing normallytakes 5-6 weeks. What are the causes? This condition is caused when a sudden, violent force is placed on a muscle andstretches it too far. This may occur with a fall, lifting, or sports. What increases the risk? This condition is more likely to develop in athletes and people who arephysically active. What are the signs or symptoms? Symptoms of this condition include: Pain. Bruising. Swelling. Trouble using the muscle. How is this diagnosed? This condition is diagnosed based on a physical exam and your medical history.Tests may also be done, including an X-ray, ultrasound, or MRI. How is this treated? This condition is initially treated with PRICE therapy. This therapy involves: Protecting the muscle from being injured again. Resting the injured muscle. Icing the injured muscle. Applying pressure (compression) to the injured muscle. This may be done with a splint or elastic bandage. Raising (elevating) the injured muscle. Your health care provider may also recommend medicine for pain. Follow these instructions at home: If you have a splint: Wear the splint as told by your health care provider. Remove it only as told by your health care provider. Loosen the splint if your fingers or toes tingle, become numb, or turn cold and blue. Keep the splint clean. If the splint is not waterproof: Do not let it get wet. Cover it with a watertight covering when you take a bath or a shower. Managing pain, stiffness, and swelling  If directed, put  ice on the injured area: If you have a removable splint, remove it as told by your health care provider. Put ice in a plastic bag. Place a towel between your skin and the bag. Leave the ice on for 20 minutes, 2-3 times a day. Move your fingers or toes often to avoid stiffness and to lessen swelling. Raise (elevate) the injured area above the level of your heart while you are sitting or lying down. Wear an elastic bandage as told by your health care provider. Make sure that it is not too tight.  General instructions Treatment may include medicines for pain and inflammation that are taken by mouth or applied to the skin, prescription pain medicine, or muscle relaxants. Take over-the-counter and prescription medicines only as told by your health care provider. Restrict your activity and rest the injured muscle as told by your health care provider. Gentle movements may be allowed. If physical therapy was prescribed, do exercises as told by your health care provider. Do not put pressure on any part of the splint until it is fully hardened. This may take several hours. Do not use any products that contain nicotine or tobacco, such as cigarettes and e-cigarettes. These can delay bone healing. If you need help quitting, ask your health care provider. Ask your health care provider when it is safe to drive if you have a splint. Keep all follow-up visits as told by your health care provider. This is important. How is this prevented? Warm up before exercising. This helps to prevent future muscle strains. Contact a health care provider if: You have more pain or swelling in the injured area. Get help   right away if: You have numbness or tingling or lose a lot of strength in the injured area. Summary A muscle strain is an injury that occurs when a muscle is stretched beyond its normal length. This condition is caused when a sudden, violent force is placed on a muscle and stretches it too far. This  condition is initially treated with PRICE therapy, which involves protecting, resting, icing, compressing, and elevating. Gentle movements may be allowed. If physical therapy was prescribed, do exercises as told by your health care provider. This information is not intended to replace advice given to you by your health care provider. Make sure you discuss any questions you have with your healthcare provider. Document Revised: 05/31/2020 Document Reviewed: 06/03/2020 Elsevier Patient Education  2022 [employer]  

## 2021-04-14 ENCOUNTER — Encounter: Payer: Self-pay | Admitting: Family Medicine

## 2021-04-14 ENCOUNTER — Ambulatory Visit (INDEPENDENT_AMBULATORY_CARE_PROVIDER_SITE_OTHER): Payer: 59 | Admitting: Family Medicine

## 2021-04-14 DIAGNOSIS — L237 Allergic contact dermatitis due to plants, except food: Secondary | ICD-10-CM

## 2021-04-14 MED ORDER — PREDNISONE 20 MG PO TABS: 40.0000 mg | ORAL_TABLET | Freq: Every day | ORAL | 0 refills | Status: AC

## 2021-04-14 NOTE — Progress Notes (Signed)
   Virtual Visit  Note Due to COVID-19 pandemic this visit was conducted virtually. This visit type was conducted due to national recommendations for restrictions regarding the COVID-19 Pandemic (e.g. social distancing, sheltering in place) in an effort to limit this patient's exposure and mitigate transmission in our community. All issues noted in this document were discussed and addressed.  A physical exam was not performed with this format.  I connected with Vincent [name] on 04/14/21 at [phone] by telephone and verified that I am speaking with the correct person using two identifiers. Vincent [name] is currently located at home and no one is currently with him during the visit. The provider, Gabriel Earing, FNP is located in their office at time of visit.  I discussed the limitations, risks, security and privacy concerns of performing an evaluation and management service by telephone and the availability of in person appointments. I also discussed with the patient that there may be a patient responsible charge related to this service. The patient expressed understanding and agreed to proceed.  CC: rash  History and Present Illness:  HPI Vincent [name] reports a poison oak rash x 1 week. The rash is on both arms and a little on his lower face. He has tried calamine lotion and cortisone cream without improvement. Denies signs of infection. 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 He does wear long sleeves and long pants at work.     ROS As per HPI.  Observations/Objective: Alert and oriented x 3. Able to speak in full sentences without difficulty.   Assessment and Plan: Vincent [name] was seen today for rash.  Diagnoses and all orders for this visit:  Poison oak dermatitis Prednisone burst as below. Continue calamine lotion.  -     predniSONE (DELTASONE) 20 MG tablet; Take 2 tablets (40 mg total) by mouth daily with breakfast for 5 days.    Follow Up  Instructions: Return to office for new or worsening symptoms, or if symptoms persist.     I discussed the assessment and treatment plan with the patient. The patient was provided an opportunity to ask questions and all were answered. The patient agreed with the plan and demonstrated an understanding of the instructions.   The patient was advised to call back or seek an in-person evaluation if the symptoms worsen or if the condition fails to improve as anticipated.  The above assessment and management plan was discussed with the patient. The patient verbalized understanding of and has agreed to the management plan. Patient is aware to call the clinic if symptoms persist or worsen. Patient is aware when to return to the clinic for a follow-up visit. Patient educated on when it is appropriate to go to the emergency department.   Time call ended:  0844  I provided 11 minutes of  non face-to-face time during this encounter.    Gabriel Earing, FNP

## 2021-04-17 ENCOUNTER — Telehealth: Payer: Self-pay | Admitting: Family Medicine

## 2021-04-17 NOTE — Telephone Encounter (Signed)
He will need to be evaluated if rash is worsening despite steroid treatment.

## 2021-04-17 NOTE — Telephone Encounter (Signed)
Spoke with patient, appointment scheduled for tomorrow at 3:50 pm with [name].

## 2021-04-18 ENCOUNTER — Ambulatory Visit (INDEPENDENT_AMBULATORY_CARE_PROVIDER_SITE_OTHER): Payer: 59 | Admitting: Family Medicine

## 2021-04-18 ENCOUNTER — Encounter: Payer: Self-pay | Admitting: Family Medicine

## 2021-04-18 ENCOUNTER — Other Ambulatory Visit: Payer: Self-pay

## 2021-04-18 VITALS — BP 135/83 | HR 71 | Temp 97.9°F | Resp 20 | Ht 69.0 in | Wt 205.0 lb

## 2021-04-18 DIAGNOSIS — L255 Unspecified contact dermatitis due to plants, except food: Secondary | ICD-10-CM

## 2021-04-18 MED ORDER — METHYLPREDNISOLONE ACETATE 40 MG/ML IJ SUSP
40.0000 mg | Freq: Once | INTRAMUSCULAR | Status: AC
  Administered 2021-04-18: 40 mg via INTRAMUSCULAR

## 2021-04-18 NOTE — Progress Notes (Signed)
Assessment & Plan:  1. Rhus dermatitis Encouraged to use tecnu to remove oils from poison oak and ivy after each exposure.  Calamine lotion/spray as needed.  Education provided on poison ivy dermatitis. - methylPREDNISolone acetate (DEPO-MEDROL) injection 40 mg - given in office   Follow up plan: Return if symptoms worsen or fail to improve.  [name], MSN, APRN, FNP-C [facility]  Subjective:   Patient ID: Vincent [name], male    DOB: [birth year], 35 y.o.   MRN: [id number]  HPI: Vincent [name] is a 35 y.o. male presenting on 04/18/2021 for Rash  Patient has been doing a lot of tree work recently.  This is the third time he has gotten into some poison oak or ivy in the past 2 months.  This round of the rash started yesterday.  He does apply spray calamine lotion.   ROS: Negative unless specifically indicated above in HPI.   Relevant past medical history reviewed and updated as indicated.   Allergies and medications reviewed and updated.   Current Outpatient Medications:    predniSONE (DELTASONE) 20 MG tablet, Take 2 tablets (40 mg total) by mouth daily with breakfast for 5 days., Disp: 10 tablet, Rfl: 0   Testosterone 20.25 MG/ACT (1.62%) GEL, APPLY TO SHOULDER AREA DAILY- MAKE SURE DRIES WELL AND NO ONE ELSE TOUCHES AREA, Disp: 75 g, Rfl: 2   trazodone (DESYREL) 300 MG tablet, TAKE 1 TABLET BY MOUTH EVERYDAY AT BEDTIME, Disp: 90 tablet, Rfl: 1   albuterol (VENTOLIN HFA) 108 (90 Base) MCG/ACT inhaler, INHALE 2 PUFFS BY MOUTH EVERY 6 HOURS AS NEEDED FOR WHEEZING OR SHORTNESS OF BREATH (Patient not taking: Reported on 04/18/2021), Disp: 8.5 each, Rfl: 2   lisinopril (ZESTRIL) 10 MG tablet, Take 1 tablet (10 mg total) by mouth daily. (Needs to be seen before next refill) (Patient not taking: Reported on 04/18/2021), Disp: 90 tablet, Rfl: 1   meloxicam (MOBIC) 15 MG tablet, Take 1 tablet (15 mg total) by mouth daily. (Patient not taking: Reported on  04/18/2021), Disp: 30 tablet, Rfl: 0  Allergies  Allergen Reactions   Tigan [Trimethobenzamide Hcl]     Objective:   BP 135/83   Pulse 71   Temp 97.9 F (36.6 C) (Temporal)   Resp 20   Ht 5\' 9"  (1.753 m)   Wt 205 lb (93 kg)   SpO2 98%   BMI 30.27 kg/m    Physical Exam Vitals reviewed.  Constitutional:      General: He is not in acute distress.    Appearance: Normal appearance. He is not ill-appearing, toxic-appearing or diaphoretic.  HENT:     Head: Normocephalic and atraumatic.  Eyes:     General: No scleral icterus.       Right eye: No discharge.        Left eye: No discharge.     Conjunctiva/sclera: Conjunctivae normal.  Cardiovascular:     Rate and Rhythm: Normal rate.  Pulmonary:     Effort: Pulmonary effort is normal. No respiratory distress.  Musculoskeletal:        General: Normal range of motion.     Cervical back: Normal range of motion.  Skin:    General: Skin is warm and dry.     Findings: Rash (bilateral arms and left side of his face) present.  Neurological:     Mental Status: He is alert and oriented to person, place, and time. Mental status is at baseline.  Psychiatric:  Mood and Affect: Mood normal.        Behavior: Behavior normal.        Thought Content: Thought content normal.        Judgment: Judgment normal.

## 2021-04-18 NOTE — Patient Instructions (Addendum)
Poison Ivy Dermatitis Poison ivy dermatitis is redness and soreness of the skin caused by chemicals in the leaves of the poison ivy plant. You may have very bad itching, swelling, a rash, and blisters. What are the causes? Touching a poison ivy plant. Touching something that has the chemical on it. This may include animals or objects that have come in contact with the plant. What increases the risk? Going outdoors often in wooded or marshy areas. Going outdoors without wearing protective clothing, such as closed shoes, long pants, and a long-sleeved shirt. What are the signs or symptoms?  Skin redness. Very bad itching. A rash that often includes bumps and blisters. The rash usually appears 48 hours after exposure, if you have been exposed before. If this is the first time you have been exposed, the rash may not appear until a week after exposure. Swelling. This may occur if the reaction is very bad. Symptoms usually last for 1-2 weeks. The first time you develop this condition, symptoms may last 3-4 weeks. How is this treated? This condition may be treated with: Hydrocortisone cream or calamine lotion to relieve itching. Oatmeal baths to soothe the skin. Medicines, such as over-the-counter antihistamine tablets. Oral steroid medicine for more severe reactions. Follow these instructions at home: Medicines Take or apply over-the-counter and prescription medicines only as told by your doctor. Use hydrocortisone cream or calamine lotion as needed to help with itching. General instructions Do not scratch or rub your skin. Put a cold, wet cloth (cold compress) on the affected areas or take baths in cool water. This will help with itching. Avoid hot baths and showers. Take oatmeal baths as needed. Use colloidal oatmeal. You can get this at a pharmacy or grocery store. Follow the instructions on the package. While you have the rash, wash your clothes right after you wear them. Keep all  follow-up visits as told by your health care provider. This is important. How is this prevented?  Know what poison ivy looks like, so you can avoid it. This plant has three leaves with flowering branches on a single stem. The leaves are glossy. The leaves have uneven edges that come to a point at the front. If you touch poison ivy, wash your skin with soap and water right away. Be sure to wash under your fingernails. When hiking or camping, wear long pants, a long-sleeved shirt, tall socks, and hiking boots. You can also use a lotion on your skin that helps to prevent contact with poison ivy. If you think that your clothes or outdoor gear came in contact with poison ivy, rinse them off with a garden hose before you bring them inside your house. When doing yard work or gardening, wear gloves, long sleeves, long pants, and boots. Wash your garden tools and gloves if they come in contact with poison ivy. If you think that your pet has come into contact with poison ivy, wash him or her with pet shampoo and water. Make sure to wear gloves while washing your pet. Contact a doctor if: You have open sores in the rash area. You have more redness, swelling, or pain in the rash area. You have redness that spreads beyond the rash area. You have fluid, blood, or pus coming from the rash area. You have a fever. You have a rash over a large area of your body. You have a rash on your eyes, mouth, or genitals. Your rash does not get better after a few weeks. Get help right away   if: Your face swells or your eyes swell shut. You have trouble breathing. You have trouble swallowing. These symptoms may be an emergency. Do not wait to see if the symptoms will go away. Get medical help right away. Call your local emergency services (911 in the [place] Do not drive yourself to the hospital. Summary Poison ivy dermatitis is redness and soreness of the skin caused by chemicals in the leaves of the poison ivy  plant. You may have skin redness, very bad itching, swelling, and a rash. Do not scratch or rub your skin. Take or apply over-the-counter and prescription medicines only as told by your doctor. This information is not intended to replace advice given to you by your health care provider. Make sure you discuss any questions you have with your health care provider. Document Revised: 01/02/2019 Document Reviewed: 09/05/2018 Elsevier Patient Education  2022 Elsevier Inc.  

## 2021-05-13 ENCOUNTER — Other Ambulatory Visit: Payer: Self-pay | Admitting: Nurse Practitioner

## 2021-05-13 DIAGNOSIS — E291 Testicular hypofunction: Secondary | ICD-10-CM

## 2021-05-18 ENCOUNTER — Ambulatory Visit: Payer: 59 | Admitting: Family Medicine

## 2021-05-21 ENCOUNTER — Other Ambulatory Visit: Payer: Self-pay | Admitting: Nurse Practitioner

## 2021-05-21 DIAGNOSIS — I10 Essential (primary) hypertension: Secondary | ICD-10-CM

## 2021-05-26 ENCOUNTER — Other Ambulatory Visit: Payer: 59

## 2021-05-26 ENCOUNTER — Other Ambulatory Visit: Payer: Self-pay

## 2021-05-26 DIAGNOSIS — R5383 Other fatigue: Secondary | ICD-10-CM

## 2021-05-26 DIAGNOSIS — I10 Essential (primary) hypertension: Secondary | ICD-10-CM

## 2021-05-26 DIAGNOSIS — E291 Testicular hypofunction: Secondary | ICD-10-CM

## 2021-05-30 ENCOUNTER — Ambulatory Visit (INDEPENDENT_AMBULATORY_CARE_PROVIDER_SITE_OTHER): Payer: 59 | Admitting: Nurse Practitioner

## 2021-05-30 ENCOUNTER — Other Ambulatory Visit: Payer: Self-pay

## 2021-05-30 ENCOUNTER — Ambulatory Visit: Payer: 59 | Admitting: Family Medicine

## 2021-05-30 ENCOUNTER — Encounter: Payer: Self-pay | Admitting: Nurse Practitioner

## 2021-05-30 ENCOUNTER — Telehealth: Payer: Self-pay | Admitting: Nurse Practitioner

## 2021-05-30 VITALS — BP 139/84 | HR 72 | Temp 98.1°F | Resp 20 | Ht 69.0 in | Wt 202.0 lb

## 2021-05-30 DIAGNOSIS — E291 Testicular hypofunction: Secondary | ICD-10-CM | POA: Diagnosis not present

## 2021-05-30 MED ORDER — TESTOSTERONE CYPIONATE 200 MG/ML IM SOLN: 200.0000 mg | Freq: Once | INTRAMUSCULAR | 2 refills | Status: DC

## 2021-05-30 NOTE — Progress Notes (Signed)
   Subjective:    Patient ID: [name], male    DOB: [birth year], 35 y.o.   MRN: [id number]   Chief Complaint: Lump in center of chest (Discuss lab results/)   HPI Patient comes in: - lump center of chest. Noticed it about 1 month ago. Slightly sore to touch. Has not changed in size. - testosterone levels were low despite using testosterone gel daily. He says he stays very fatigued despite losing weight and working out several days a week.    Review of Systems  Constitutional:  Negative for diaphoresis.  Eyes:  Negative for pain.  Respiratory:  Negative for shortness of breath.   Cardiovascular:  Negative for chest pain, palpitations and leg swelling.  Gastrointestinal:  Negative for abdominal pain.  Endocrine: Negative for polydipsia.  Skin:  Negative for rash.  Neurological:  Negative for dizziness, weakness and headaches.  Hematological:  Does not bruise/bleed easily.  All other systems reviewed and are negative.     Objective:   Physical Exam Vitals and nursing note reviewed.  Constitutional:      Appearance: Normal appearance. He is well-developed.  Neck:     Thyroid: No thyroid mass or thyromegaly.     Vascular: No carotid bruit or JVD.     Trachea: Phonation normal.  Cardiovascular:     Rate and Rhythm: Normal rate and regular rhythm.  Pulmonary:     Effort: Pulmonary effort is normal. No respiratory distress.     Breath sounds: Normal breath sounds.  Abdominal:     General: Bowel sounds are normal.     Palpations: Abdomen is soft.     Tenderness: There is no abdominal tenderness.  Musculoskeletal:        General: Normal range of motion.     Cervical back: Normal range of motion and neck supple.     Comments: prominent xyphoid process  Lymphadenopathy:     Cervical: No cervical adenopathy.  Skin:    General: Skin is warm and dry.  Neurological:     Mental Status: He is alert and oriented to person, place, and time.  Psychiatric:        Behavior:  Behavior normal.        Thought Content: Thought content normal.        Judgment: Judgment normal.    BP 139/84   Pulse 72   Temp 98.1 F (36.7 C) (Temporal)   Resp 20   Ht 5\' 9"  (1.753 m)   Wt 202 lb (91.6 kg)   SpO2 95%   BMI 29.83 kg/m        Assessment & Plan:   in today with chief complaint of Lump in center of chest (Discuss lab results/)   1. 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  Dispense: 10 mL; Refill: 2    The above assessment and management plan was discussed with the patient. The patient verbalized understanding of and has agreed to the management plan. Patient is aware to call the clinic if symptoms persist or worsen. Patient is aware when to return to the clinic for a follow-up visit. Patient educated on when it is appropriate to go to the emergency department.   Mary-Margaret Engineer, civil (consulting), FNP

## 2021-05-30 NOTE — Telephone Encounter (Signed)
Pharmacy should give him needles he needs- or just tell him what yall use here in office.

## 2021-05-30 NOTE — Telephone Encounter (Signed)
Spoke with pharmacist at [facility] and she said to have patient ask for syringes and needles when he picks up medication. Patient notified and verbalized understanding

## 2021-05-30 NOTE — Patient Instructions (Signed)
Hypogonadism, Male ?Male hypogonadism is a condition of having a level of testosterone that is lower than normal. Testosterone is a chemical, or hormone, that is made mainly in the testicles. ?In boys, testosterone is responsible for the development of male characteristics during puberty. These include: ?Making the penis bigger. ?Growing and building the muscles. ?Growing facial hair. ?Deepening the voice. ?In adult men, testosterone is responsible for maintaining: ?An interest in sex and the ability to have sex. ?Muscle mass. ?Sperm production. ?Red blood cell production. ?Bone strength. ?Testosterone also gives men energy and a sense of well-being. ?Testosterone normally decreases as men age and the testicles make less testosterone. Testosterone levels can vary from man to man. Not all men will have signs and symptoms of low testosterone. Weight, alcohol use, medicines, and certain medical conditions can affect a man's testosterone level. ?What are the causes? ?This condition is caused by: ?A natural decrease in testosterone that occurs as a man grows older. This is the main cause of this condition. ?Use of medicines, such as antidepressants, steroids, and opioids. ?Diseases and conditions that affect the testicles or the making of testosterone. These include: ?Injury or damage to the testicles from trauma, cancer, cancer treatment, or infection. ?Diabetes. ?Sleep apnea. ?Genetic conditions that men are born with. ?Disease of the pituitary gland. This gland is in the brain. It produces hormones. ?Obesity. ?Metabolic syndrome. This is a group of diseases that affect blood pressure, blood sugar, cholesterol, and belly fat. ?HIV or AIDS. ?Alcohol abuse. ?Kidney failure. ?Other long-term or chronic diseases. ?What are the signs or symptoms? ?Common symptoms of this condition include: ?Loss of interest in sex (low sex drive). ?Inability to have or maintain an erection (erectile dysfunction). ?Feeling tired  (fatigue). ?Mood changes, like irritability or depression. ?Loss of muscle and body hair. ?Infertility. ?Large breasts. ?Weight gain (obesity). ?How is this diagnosed? ?Your health care provider can diagnose hypogonadism based on: ?Your signs and symptoms. ?A physical exam to check your testosterone levels. This includes blood tests. Testosterone levels can change throughout the day. Levels are highest in the morning. You may need to have repeat blood tests before getting a diagnosis of hypogonadism. ?Depending on your medical history and test results, your health care provider may also do other tests to find the cause of low testosterone. ?How is this treated? ?This condition is treated with testosterone replacement therapy. Testosterone can be given by: ?Injection or through pellets inserted under the skin. ?Gels or patches placed on the skin or in the mouth. ?Testosterone therapy is not for everyone. It has risks and side effects. Your health care provider will consider your medical history, your risk for prostate cancer, your age, and your symptoms before putting you on testosterone replacement therapy. ?Follow these instructions at home: ?Take over-the-counter and prescription medicines only as told by your health care provider. ?Eat foods that are high in fiber, such as beans, whole grains, and fresh fruits and vegetables. Limit foods that are high in fat and processed sugars, such as fried or sweet foods. ?If you drink alcohol: ?Limit how much you have to 0-2 drinks a day. ?Know how much alcohol is in your drink. In the [place] one drink equals one 12 oz bottle of beer (355 mL), one 5 oz glass of wine (148 mL), or one 1? oz glass of hard liquor (44 mL). ?Return to your normal activities as told by your health care provider. Ask your health care provider what activities are safe for you. ?Keep all follow-up   visits. This is important. ?Contact a health care provider if: ?You have any of the signs or symptoms of  low testosterone. ?You have any side effects from testosterone therapy. ?Summary ?Male hypogonadism is a condition of having a level of testosterone that is lower than normal. ?The natural drop in testosterone production that occurs with age is the most common cause of this condition. ?Low testosterone can also be caused by many diseases and conditions that affect the testicles and the making of testosterone. ?This condition is treated with testosterone replacement therapy. ?There are risks and side effects of testosterone therapy. Your health care provider will consider your age, medical history, symptoms, and risks for prostate cancer before putting you on testosterone therapy. ?This information is not intended to replace advice given to you by your health care provider. Make sure you discuss any questions you have with your health care provider. ?Document Revised: 05/12/2020 Document Reviewed: 05/12/2020 ?Elsevier Patient Education ? 2022 Elsevier Inc. ? ?

## 2021-05-31 LAB — CBC WITH DIFFERENTIAL/PLATELET
Basophils Absolute: 0 10*3/uL (ref 0.0–0.2)
Basos: 1 %
EOS (ABSOLUTE): 0.2 10*3/uL (ref 0.0–0.4)
Eos: 3 %
Hematocrit: 46.3 % (ref 37.5–51.0)
Hemoglobin: 15.5 g/dL (ref 13.0–17.7)
Immature Grans (Abs): 0 10*3/uL (ref 0.0–0.1)
Immature Granulocytes: 0 %
Lymphocytes Absolute: 2.3 10*3/uL (ref 0.7–3.1)
Lymphs: 40 %
MCH: 28.7 pg (ref 26.6–33.0)
MCHC: 33.5 g/dL (ref 31.5–35.7)
MCV: 86 fL (ref 79–97)
Monocytes Absolute: 0.5 10*3/uL (ref 0.1–0.9)
Monocytes: 9 %
Neutrophils Absolute: 2.7 10*3/uL (ref 1.4–7.0)
Neutrophils: 47 %
Platelets: 262 10*3/uL (ref 150–450)
RBC: 5.41 x10E6/uL (ref 4.14–5.80)
RDW: 14.5 % (ref 11.6–15.4)
WBC: 5.7 10*3/uL (ref 3.4–10.8)

## 2021-05-31 LAB — CMP14+EGFR
ALT: 19 IU/L (ref 0–44)
AST: 25 IU/L (ref 0–40)
Albumin/Globulin Ratio: 2.7 — ABNORMAL HIGH (ref 1.2–2.2)
Albumin: 4.6 g/dL (ref 4.0–5.0)
Alkaline Phosphatase: 53 IU/L (ref 44–121)
BUN/Creatinine Ratio: 14 (ref 9–20)
BUN: 16 mg/dL (ref 6–20)
Bilirubin Total: 0.4 mg/dL (ref 0.0–1.2)
CO2: 23 mmol/L (ref 20–29)
Calcium: 9.5 mg/dL (ref 8.7–10.2)
Chloride: 105 mmol/L (ref 96–106)
Creatinine, Ser: 1.17 mg/dL (ref 0.76–1.27)
Globulin, Total: 1.7 g/dL (ref 1.5–4.5)
Glucose: 87 mg/dL (ref 65–99)
Potassium: 5.1 mmol/L (ref 3.5–5.2)
Sodium: 140 mmol/L (ref 134–144)
Total Protein: 6.3 g/dL (ref 6.0–8.5)
eGFR: 83 mL/min/{1.73_m2} (ref >59)

## 2021-05-31 LAB — THYROID PANEL WITH TSH
Free Thyroxine Index: 1.5 (ref 1.2–4.9)
T3 Uptake Ratio: 27 % (ref 24–39)
T4, Total: 5.6 ug/dL (ref 4.5–12.0)
TSH: 3.41 u[IU]/mL (ref 0.450–4.500)

## 2021-05-31 LAB — TESTOSTERONE,FREE AND TOTAL
Testosterone, Free: 1 pg/mL — ABNORMAL LOW (ref 8.7–25.1)
Testosterone: 23 ng/dL — ABNORMAL LOW (ref 264–916)

## 2021-05-31 LAB — LIPID PANEL
Chol/HDL Ratio: 4 ratio (ref 0.0–5.0)
Cholesterol, Total: 202 mg/dL — ABNORMAL HIGH (ref 100–199)
HDL: 50 mg/dL (ref >39)
LDL Chol Calc (NIH): 120 mg/dL — ABNORMAL HIGH (ref 0–99)
Triglycerides: 184 mg/dL — ABNORMAL HIGH (ref 0–149)
VLDL Cholesterol Cal: 32 mg/dL (ref 5–40)

## 2021-06-09 ENCOUNTER — Telehealth: Payer: Self-pay | Admitting: Nurse Practitioner

## 2021-06-09 NOTE — Telephone Encounter (Signed)
Can do just for 2 weeks

## 2021-06-09 NOTE — Telephone Encounter (Signed)
Pt called stating that he saw provider a few weeks ago and his testosterone levels were really low so he started taking medicine for it but pt says he feels worse now than he did. Wants to know if he can take weekly injections instead of biweekly injections?  Please advise and call patient.

## 2021-06-09 NOTE — Telephone Encounter (Signed)
Patient notified to take 3 weeks in a row and then return to every 2 weeks. Advised patient if he has any issues at the pharmacy to contact our office.

## 2021-06-12 ENCOUNTER — Telehealth: Payer: Self-pay | Admitting: Nurse Practitioner

## 2021-06-12 ENCOUNTER — Other Ambulatory Visit: Payer: Self-pay | Admitting: *Deleted

## 2021-06-12 DIAGNOSIS — E291 Testicular hypofunction: Secondary | ICD-10-CM

## 2021-06-12 MED ORDER — TESTOSTERONE CYPIONATE 200 MG/ML IM SOLN: INTRAMUSCULAR | 2 refills | Status: DC

## 2021-06-12 NOTE — Progress Notes (Signed)
Pt called on call nurse about refill - due for shot today and will not have enough med. This is controlled and was started on 8/22, please advise if RF is ok.

## 2021-06-12 NOTE — Telephone Encounter (Signed)
Changed roder to 1 injection weekly for 1 month , then QOW for 1 month then monthly

## 2021-06-12 NOTE — Telephone Encounter (Signed)
Pt asked for a call back when done

## 2021-06-12 NOTE — Telephone Encounter (Signed)
Pt aware.

## 2021-06-15 ENCOUNTER — Other Ambulatory Visit: Payer: Self-pay | Admitting: Nurse Practitioner

## 2021-06-15 DIAGNOSIS — F5101 Primary insomnia: Secondary | ICD-10-CM

## 2021-06-22 ENCOUNTER — Other Ambulatory Visit: Payer: Self-pay | Admitting: Nurse Practitioner

## 2021-06-22 DIAGNOSIS — E291 Testicular hypofunction: Secondary | ICD-10-CM

## 2021-06-29 ENCOUNTER — Other Ambulatory Visit: Payer: Self-pay | Admitting: Nurse Practitioner

## 2021-06-29 DIAGNOSIS — E291 Testicular hypofunction: Secondary | ICD-10-CM

## 2021-07-02 ENCOUNTER — Other Ambulatory Visit: Payer: Self-pay | Admitting: Nurse Practitioner

## 2021-07-02 DIAGNOSIS — E291 Testicular hypofunction: Secondary | ICD-10-CM

## 2021-07-03 NOTE — Telephone Encounter (Signed)
LMOVM for pt to call back to clarify. I have denied the gel twice, noting that pt had changed to injectin on 06/12/21.

## 2021-07-03 NOTE — Telephone Encounter (Signed)
Pt calling back about his refill on the testosterone gel. Said his level is so low, that he is suppose to be on both the gel & injection. He is still feeling terrible.

## 2021-07-06 NOTE — Telephone Encounter (Signed)
LMOVM testosterone gel has been refilled

## 2021-07-27 ENCOUNTER — Other Ambulatory Visit: Payer: Self-pay

## 2021-07-27 ENCOUNTER — Encounter: Payer: Self-pay | Admitting: Family Medicine

## 2021-07-27 ENCOUNTER — Ambulatory Visit (INDEPENDENT_AMBULATORY_CARE_PROVIDER_SITE_OTHER): Payer: 59 | Admitting: Family Medicine

## 2021-07-27 VITALS — BP 139/81 | HR 101 | Temp 98.7°F | Ht 69.0 in | Wt 217.2 lb

## 2021-07-27 DIAGNOSIS — R5383 Other fatigue: Secondary | ICD-10-CM

## 2021-07-27 DIAGNOSIS — F418 Other specified anxiety disorders: Secondary | ICD-10-CM

## 2021-07-27 DIAGNOSIS — E291 Testicular hypofunction: Secondary | ICD-10-CM

## 2021-07-27 DIAGNOSIS — I10 Essential (primary) hypertension: Secondary | ICD-10-CM

## 2021-07-27 MED ORDER — TESTOSTERONE CYPIONATE 200 MG/ML IJ SOLN: 1.0000 mL | INTRAMUSCULAR | 1 refills | Status: DC

## 2021-07-27 NOTE — Progress Notes (Signed)
Subjective:  Patient ID: [name], male    DOB: 08/25/1986  Age: 35 y.o. MRN: [id number]  CC: Fatigue and low testosterone   HPI Not sleeping. MInd going 300 miles an hour. Wakes up tired. No energy. Not  sleeping well. Wife frustrated that he is lazy. He used to keep the house clean and start supper. No libido. Can't get a full erection. Wife concerned he is cheating. Now no sex X a month. USed to have sex 5 times a week. Vasectomy at age 66   Depression screen [facility] 2/9 07/27/2021 05/30/2021 04/18/2021  Decreased Interest 3 1 0  Down, Depressed, Hopeless 2 0 0  PHQ - 2 Score 5 1 0  Altered sleeping 3 1 0  Tired, decreased energy 3 3 0  Change in appetite 3 0 0  Feeling bad or failure about yourself  0 0 0  Trouble concentrating 3 1 0  Moving slowly or fidgety/restless 0 0 0  Suicidal thoughts 0 0 0  PHQ-9 Score 17 6 0  Difficult doing work/chores Extremely dIfficult Somewhat difficult -  Some recent data might be hidden    History Karo has a past medical history of ADHD (attention deficit hyperactivity disorder), Anxiety, and Asthma.   He has a past surgical history that includes Mandible surgery (2009) and Vasectomy (2007).   His Family history is unknown by patient.He reports that he has quit smoking. His smoking use included cigarettes. He smoked an average of .5 packs per day. He has never used smokeless tobacco. He reports that he does not drink alcohol and does not use drugs.    ROS Review of Systems  Constitutional:  Positive for activity change, fatigue and unexpected weight change.  HENT: Negative.    Eyes:  Negative for visual disturbance.  Respiratory:  Negative for cough and shortness of breath.   Cardiovascular:  Negative for chest pain and leg swelling.  Gastrointestinal:  Negative for abdominal pain, diarrhea, nausea and vomiting.  Genitourinary:  Negative for difficulty urinating.  Musculoskeletal:  Negative for arthralgias and myalgias.  Skin:   Negative for rash.  Neurological:  Negative for headaches.  Psychiatric/Behavioral:  Negative for sleep disturbance.    Objective:  BP 139/81   Pulse (!) 101   Temp 98.7 F (37.1 C)   Ht 5\' 9"  (1.753 m)   Wt 217 lb 3.2 oz (98.5 kg)   SpO2 95%   BMI 32.07 kg/m   BP Readings from Last 3 Encounters:  07/27/21 139/81  05/30/21 139/84  04/18/21 135/83    Wt Readings from Last 3 Encounters:  07/27/21 217 lb 3.2 oz (98.5 kg)  05/30/21 202 lb (91.6 kg)  04/18/21 205 lb (93 kg)     Physical Exam Constitutional:      General: He is not in acute distress.    Appearance: He is well-developed.  HENT:     Head: Normocephalic and atraumatic.     Right Ear: External ear normal.     Left Ear: External ear normal.     Nose: Nose normal.  Eyes:     Conjunctiva/sclera: Conjunctivae normal.     Pupils: Pupils are equal, round, and reactive to light.  Cardiovascular:     Rate and Rhythm: Normal rate and regular rhythm.     Heart sounds: Normal heart sounds. No murmur heard. Pulmonary:     Effort: Pulmonary effort is normal. No respiratory distress.     Breath sounds: Normal breath sounds. No wheezing or  rales.  Abdominal:     Palpations: Abdomen is soft.     Tenderness: There is no abdominal tenderness.  Musculoskeletal:        General: Normal range of motion.     Cervical back: Normal range of motion and neck supple.  Skin:    General: Skin is warm and dry.  Neurological:     Mental Status: He is alert and oriented to person, place, and time.     Deep Tendon Reflexes: Reflexes are normal and symmetric.  Psychiatric:        Behavior: Behavior normal.        Thought Content: Thought content normal.        Judgment: Judgment normal.      Assessment & Plan:   Khamar was seen today for fatigue and low testosterone.  Diagnoses and all orders for this visit:  Hypogonadism in male -     Testosterone,Free and Total; Standing -     MR BRAIN W WO CONTRAST;  Future  Essential hypertension  Fatigue, unspecified type  Anxiety with depression  Other orders -     Testosterone Cypionate 200 MG/ML SOLN; Inject 1 mL as directed once a week.      I have discontinued Cydney Ok. Schumm's testosterone cypionate and Testosterone. I am also having him start on Testosterone Cypionate. Additionally, I am having him maintain his trazodone.  Allergies as of 07/27/2021       Reactions   Tigan [trimethobenzamide Hcl]         Medication List        Accurate as of July 27, 2021 11:59 PM. If you have any questions, ask your nurse or doctor.          STOP taking these medications    Testosterone 20.25 MG/ACT (1.62%) Gel Stopped by: Claretta Fraise, MD   testosterone cypionate 200 MG/ML injection Commonly known as: DEPOTESTOSTERONE CYPIONATE Replaced by: Testosterone Cypionate 200 MG/ML Soln Stopped by: Claretta Fraise, MD       TAKE these medications    Testosterone Cypionate 200 MG/ML Soln Inject 1 mL as directed once a week. Replaces: testosterone cypionate 200 MG/ML injection Started by: Claretta Fraise, MD   trazodone 300 MG tablet Commonly known as: DESYREL TAKE 1 TABLET BY MOUTH EVERYDAY AT BEDTIME         Follow-up: Return in about 1 month (around 08/26/2021).  Claretta Fraise, M.D.

## 2021-07-30 ENCOUNTER — Encounter: Payer: Self-pay | Admitting: Family Medicine

## 2021-07-30 DIAGNOSIS — F418 Other specified anxiety disorders: Secondary | ICD-10-CM | POA: Insufficient documentation

## 2021-08-25 ENCOUNTER — Telehealth: Payer: Self-pay | Admitting: Nurse Practitioner

## 2021-08-25 NOTE — Telephone Encounter (Signed)
Pt scheduled for labs for testosterone and then get testesterone inj after. He has been getting his shots at home by wife. This will be his 5th weekly shot today.

## 2021-08-25 NOTE — Telephone Encounter (Signed)
Pt needs testosterone shot on 12/5 but no slots open except triage. Please call back and advise.

## 2021-08-28 ENCOUNTER — Other Ambulatory Visit: Payer: 59

## 2021-08-28 ENCOUNTER — Ambulatory Visit: Payer: 59

## 2021-08-28 DIAGNOSIS — E291 Testicular hypofunction: Secondary | ICD-10-CM

## 2021-08-29 ENCOUNTER — Other Ambulatory Visit: Payer: 59

## 2021-08-29 DIAGNOSIS — E291 Testicular hypofunction: Secondary | ICD-10-CM

## 2021-08-29 LAB — TESTOSTERONE,FREE AND TOTAL
Testosterone, Free: 34.4 pg/mL — ABNORMAL HIGH (ref 8.7–25.1)
Testosterone: 1050 ng/dL — ABNORMAL HIGH (ref 264–916)

## 2021-08-30 ENCOUNTER — Other Ambulatory Visit: Payer: Self-pay | Admitting: Family Medicine

## 2021-08-30 MED ORDER — TESTOSTERONE CYPIONATE 200 MG/ML IJ SOLN: 0.7500 mL | INTRAMUSCULAR | 1 refills | Status: DC

## 2021-08-30 NOTE — Progress Notes (Signed)
Level came back high. Will reduce dose.

## 2021-08-31 ENCOUNTER — Telehealth: Payer: Self-pay | Admitting: Nurse Practitioner

## 2021-08-31 ENCOUNTER — Ambulatory Visit (INDEPENDENT_AMBULATORY_CARE_PROVIDER_SITE_OTHER): Payer: 59 | Admitting: Family Medicine

## 2021-08-31 DIAGNOSIS — J0110 Acute frontal sinusitis, unspecified: Secondary | ICD-10-CM | POA: Diagnosis not present

## 2021-08-31 LAB — TESTOSTERONE,FREE AND TOTAL
Testosterone, Free: 22.7 pg/mL (ref 8.7–25.1)
Testosterone: 978 ng/dL — ABNORMAL HIGH (ref 264–916)

## 2021-08-31 MED ORDER — AMOXICILLIN-POT CLAVULANATE 875-125 MG PO TABS: 1.0000 | ORAL_TABLET | Freq: Two times a day (BID) | ORAL | 0 refills | Status: DC

## 2021-08-31 MED ORDER — FLUTICASONE PROPIONATE 50 MCG/ACT NA SUSP: 1.0000 | Freq: Two times a day (BID) | NASAL | 6 refills | Status: DC | PRN

## 2021-08-31 NOTE — Telephone Encounter (Signed)
Left detailed voicemail

## 2021-08-31 NOTE — Progress Notes (Signed)
Virtual Visit via telephone Note  I connected with Vincent [name] on 08/31/21 at 1120 by telephone and verified that I am speaking with the correct person using two identifiers. Vincent [name] is currently located at home and patient are currently with her during visit. The provider, Elige Radon Chet Greenley, MD is located in their office at time of visit.  Call ended at 1126  I discussed the limitations, risks, security and privacy concerns of performing an evaluation and management service by telephone and the availability of in person appointments. I also discussed with the patient that there may be a patient responsible charge related to this service. The patient expressed understanding and agreed to proceed.   History and Present Illness: Patient is calling in for post covid.  He had covid 3 weeks ago.  He has taken sudafed.  His right side has a lot of pressure in sinuses and left ear and under the eye. He denies any fevers or chills, body aches or shortness of breath or wheezing. The pressure and headaches are worsening. He bought a nasal flushing device and it is not working.   1. Subacute frontal sinusitis     Outpatient Encounter Medications as of 08/31/2021  Medication Sig   amoxicillin-clavulanate (AUGMENTIN) 875-125 MG tablet Take 1 tablet by mouth 2 (two) times daily.   fluticasone (FLONASE) 50 MCG/ACT nasal spray Place 1 spray into both nostrils 2 (two) times daily as needed for allergies or rhinitis.   Testosterone Cypionate 200 MG/ML SOLN Inject 0.75 mLs as directed once a week.   trazodone (DESYREL) 300 MG tablet TAKE 1 TABLET BY MOUTH EVERYDAY AT BEDTIME   No facility-administered encounter medications on file as of 08/31/2021.    Review of Systems  Constitutional:  Negative for chills and fever.  HENT:  Positive for congestion, postnasal drip, rhinorrhea, sinus pressure, sneezing and sore throat. Negative for ear discharge, ear pain and voice change.   Eyes:  Negative  for pain, discharge, redness and visual disturbance.  Respiratory:  Negative for shortness of breath and wheezing.   Cardiovascular:  Negative for chest pain and leg swelling.  Musculoskeletal:  Negative for gait problem.  Skin:  Negative for rash.  All other systems reviewed and are negative.  Observations/Objective: Patient sounds comfortable an din no acute distress  Assessment and Plan: Problem List Items Addressed This Visit       Respiratory   Subacute frontal sinusitis - Primary   Relevant Medications   amoxicillin-clavulanate (AUGMENTIN) 875-125 MG tablet   fluticasone (FLONASE) 50 MCG/ACT nasal spray    Will treat like sinus infection because of duration.  We will give Flonase and Augmentin. Follow up plan: Return if symptoms worsen or fail to improve.     I discussed the assessment and treatment plan with the patient. The patient was provided an opportunity to ask questions and all were answered. The patient agreed with the plan and demonstrated an understanding of the instructions.   The patient was advised to call back or seek an in-person evaluation if the symptoms worsen or if the condition fails to improve as anticipated.  The above assessment and management plan was discussed with the patient. The patient verbalized understanding of and has agreed to the management plan. Patient is aware to call the clinic if symptoms persist or worsen. Patient is aware when to return to the clinic for a follow-up visit. Patient educated on when it is appropriate to go to the emergency department.  I provided 6 minutes of non-face-to-face time during this encounter.    Worthy Rancher, MD

## 2021-09-27 ENCOUNTER — Other Ambulatory Visit: Payer: 59

## 2021-09-27 DIAGNOSIS — E291 Testicular hypofunction: Secondary | ICD-10-CM

## 2021-09-28 LAB — TESTOSTERONE,FREE AND TOTAL
Testosterone, Free: 27.2 pg/mL — ABNORMAL HIGH (ref 8.7–25.1)
Testosterone: 907 ng/dL (ref 264–916)

## 2021-09-28 NOTE — Progress Notes (Signed)
Hello Dalessandro,  Your lab result is normal and/or stable.Some minor variations that are not significant are commonly marked abnormal, but do not represent any medical problem for you.  Best regards, Kamdin Follett, M.D.

## 2021-12-07 ENCOUNTER — Encounter: Payer: Self-pay | Admitting: Family

## 2021-12-07 ENCOUNTER — Ambulatory Visit (INDEPENDENT_AMBULATORY_CARE_PROVIDER_SITE_OTHER): Payer: 59 | Admitting: Family

## 2021-12-07 DIAGNOSIS — J019 Acute sinusitis, unspecified: Secondary | ICD-10-CM | POA: Diagnosis not present

## 2021-12-07 MED ORDER — CETIRIZINE HCL 10 MG PO TABS: 10.0000 mg | ORAL_TABLET | Freq: Every day | ORAL | 1 refills | Status: DC

## 2021-12-07 MED ORDER — FLUTICASONE PROPIONATE 50 MCG/ACT NA SUSP: 1.0000 | Freq: Two times a day (BID) | NASAL | 6 refills | Status: DC | PRN

## 2021-12-07 MED ORDER — AMOXICILLIN-POT CLAVULANATE 875-125 MG PO TABS: 1.0000 | ORAL_TABLET | Freq: Two times a day (BID) | ORAL | 0 refills | Status: DC

## 2021-12-07 NOTE — Progress Notes (Signed)
? ?Virtual Visit  Note ?Due to COVID-19 pandemic this visit was conducted virtually. This visit type was conducted due to national recommendations for restrictions regarding the COVID-19 Pandemic (e.g. social distancing, sheltering in place) in an effort to limit this patient's exposure and mitigate transmission in our community. All issues noted in this document were discussed and addressed.  A physical exam was not performed with this format. ? ?I connected with Vincent [name] on 12/07/21 at 12:41 pm  by telephone and verified that I am speaking with the correct person using two identifiers. Vincent [name] is currently located at work and no one is currently with him during visit. The provider, Jannifer Rodney, FNP is located in their office at time of visit. ? ?I discussed the limitations, risks, security and privacy concerns of performing an evaluation and management service by telephone and the availability of in person appointments. I also discussed with the patient that there may be a patient responsible charge related to this service. The patient expressed understanding and agreed to proceed. ? ?Vincent [name],you are scheduled for a virtual visit with your provider today.   ? ?Just as we do with appointments in the office, we must obtain your consent to participate.  Your consent will be active for this visit and any virtual visit you may have with one of our providers in the next 365 days.   ? ?If you have a MyChart account, I can also send a copy of this consent to you electronically.  All virtual visits are billed to your insurance company just like a traditional visit in the office.  As this is a virtual visit, video technology does not allow for your provider to perform a traditional examination.  This may limit your provider's ability to fully assess your condition.  If your provider identifies any concerns that need to be evaluated in person or the need to arrange testing such as labs, EKG, etc, we  will make arrangements to do so.   ? ?Although advances in technology are sophisticated, we cannot ensure that it will always work on either your end or our end.  If the connection with a video visit is poor, we may have to switch to a telephone visit.  With either a video or telephone visit, we are not always able to ensure that we have a secure connection.   I need to obtain your verbal consent now.   Are you willing to proceed with your visit today?  ? ?Vincent [name] has provided verbal consent on 12/07/2021 for a virtual visit (video or telephone). ? ? ?Jannifer Rodney, FNP ?12/07/2021  12:43 PM ? ? ?History and Present Illness: ? ?Sinusitis ?This is a new problem. The current episode started 1 to 4 weeks ago. There has been no fever. His pain is at a severity of 3/10. The pain is mild. Associated symptoms include congestion, ear pain (right), headaches, sinus pressure and sneezing. Pertinent negatives include no coughing, shortness of breath or sore throat. Past treatments include oral decongestants. The treatment provided mild relief.  ? ? ? ?Review of Systems  ?HENT:  Positive for congestion, ear pain (right), sinus pressure and sneezing. Negative for sore throat.   ?Respiratory:  Negative for cough and shortness of breath.   ?Neurological:  Positive for headaches.  ? ? ?Observations/Objective: ?No SOB or distress noted  ? ?Assessment and Plan: ?1. Acute sinusitis, recurrence not specified, unspecified location ?- Take meds as prescribed ?- Use a cool mist  humidifier  ?-Use saline nose sprays frequently ?-Force fluids ?-For any cough or congestion ? Use plain Mucinex- regular strength or max strength is fine ?-For fever or aces or pains- take tylenol or ibuprofen. ?-Throat lozenges if help ?-Follow up if symptoms worsen or do not improve  ?- amoxicillin-clavulanate (AUGMENTIN) 875-125 MG tablet; Take 1 tablet by mouth 2 (two) times daily.  Dispense: 14 tablet; Refill: 0 ?- fluticasone (FLONASE) 50 MCG/ACT  nasal spray; Place 1 spray into both nostrils 2 (two) times daily as needed for allergies or rhinitis.  Dispense: 16 g; Refill: 6 ?- cetirizine (ZYRTEC ALLERGY) 10 MG tablet; Take 1 tablet (10 mg total) by mouth daily.  Dispense: 90 tablet; Refill: 1 ? ? ?  ?I discussed the assessment and treatment plan with the patient. The patient was provided an opportunity to ask questions and all were answered. The patient agreed with the plan and demonstrated an understanding of the instructions. ?  ?The patient was advised to call back or seek an in-person evaluation if the symptoms worsen or if the condition fails to improve as anticipated. ? ?The above assessment and management plan was discussed with the patient. The patient verbalized understanding of and has agreed to the management plan. Patient is aware to call the clinic if symptoms persist or worsen. Patient is aware when to return to the clinic for a follow-up visit. Patient educated on when it is appropriate to go to the emergency department.  ? ?Time call ended:  12:53 pm  ? ?I provided 12 minutes of  non face-to-face time during this encounter. ? ? ? ?Jannifer Rodney, FNP ? ? ?

## 2022-01-30 ENCOUNTER — Other Ambulatory Visit: Payer: Self-pay | Admitting: Nurse Practitioner

## 2022-01-30 DIAGNOSIS — J4520 Mild intermittent asthma, uncomplicated: Secondary | ICD-10-CM

## 2022-01-31 ENCOUNTER — Encounter: Payer: Self-pay | Admitting: Nurse Practitioner

## 2022-01-31 ENCOUNTER — Other Ambulatory Visit: Payer: Self-pay | Admitting: Nurse Practitioner

## 2022-01-31 DIAGNOSIS — F5101 Primary insomnia: Secondary | ICD-10-CM

## 2022-01-31 NOTE — Telephone Encounter (Signed)
MMM NTBS past 6 mos ckup. RF NOT sent ?

## 2022-01-31 NOTE — Telephone Encounter (Signed)
Lmtcb. Letter mailed 

## 2022-02-05 ENCOUNTER — Telehealth: Payer: Self-pay | Admitting: Nurse Practitioner

## 2022-02-05 NOTE — Telephone Encounter (Signed)
Please work in.

## 2022-02-05 NOTE — Telephone Encounter (Signed)
Pt called to schedule med refill appt with [facility]. Says he wants to be seen tomorrow if possible because its his only day off. ? ?[facility] does not have any openings. Please advise and call patient.  ?

## 2022-02-05 NOTE — Telephone Encounter (Signed)
Patient called back and appt made for tomorrow.

## 2022-02-05 NOTE — Telephone Encounter (Signed)
Called patient lmtcb 

## 2022-02-06 ENCOUNTER — Ambulatory Visit (INDEPENDENT_AMBULATORY_CARE_PROVIDER_SITE_OTHER): Payer: 59 | Admitting: Nurse Practitioner

## 2022-02-06 ENCOUNTER — Encounter: Payer: Self-pay | Admitting: Nurse Practitioner

## 2022-02-06 VITALS — BP 129/74 | HR 70 | Temp 98.2°F | Resp 20 | Ht 69.0 in | Wt 200.0 lb

## 2022-02-06 DIAGNOSIS — J4520 Mild intermittent asthma, uncomplicated: Secondary | ICD-10-CM

## 2022-02-06 DIAGNOSIS — I10 Essential (primary) hypertension: Secondary | ICD-10-CM | POA: Diagnosis not present

## 2022-02-06 DIAGNOSIS — E291 Testicular hypofunction: Secondary | ICD-10-CM | POA: Diagnosis not present

## 2022-02-06 DIAGNOSIS — F5101 Primary insomnia: Secondary | ICD-10-CM

## 2022-02-06 DIAGNOSIS — F418 Other specified anxiety disorders: Secondary | ICD-10-CM

## 2022-02-06 DIAGNOSIS — Z6834 Body mass index (BMI) 34.0-34.9, adult: Secondary | ICD-10-CM

## 2022-02-06 MED ORDER — LISINOPRIL 10 MG PO TABS: 10.0000 mg | ORAL_TABLET | Freq: Every day | ORAL | 1 refills | Status: DC

## 2022-02-06 MED ORDER — FLUTICASONE FUROATE-VILANTEROL 100-25 MCG/ACT IN AEPB: 1.0000 | INHALATION_SPRAY | Freq: Every day | RESPIRATORY_TRACT | 11 refills | Status: DC

## 2022-02-06 MED ORDER — FLUTICASONE PROPIONATE 50 MCG/ACT NA SUSP: 1.0000 | Freq: Two times a day (BID) | NASAL | 6 refills | Status: DC | PRN

## 2022-02-06 MED ORDER — ALBUTEROL SULFATE HFA 108 (90 BASE) MCG/ACT IN AERS: 2.0000 | INHALATION_SPRAY | Freq: Four times a day (QID) | RESPIRATORY_TRACT | 0 refills | Status: DC | PRN

## 2022-02-06 MED ORDER — TRAZODONE HCL 300 MG PO TABS: 300.0000 mg | ORAL_TABLET | Freq: Every day | ORAL | 1 refills | Status: DC

## 2022-02-06 MED ORDER — CETIRIZINE HCL 10 MG PO TABS: 10.0000 mg | ORAL_TABLET | Freq: Every day | ORAL | 1 refills | Status: DC

## 2022-02-06 MED ORDER — TESTOSTERONE CYPIONATE 200 MG/ML IJ SOLN: 0.7500 mL | INTRAMUSCULAR | 1 refills | Status: DC

## 2022-02-06 NOTE — Patient Instructions (Signed)
Insomnia Insomnia is a sleep disorder that makes it difficult to fall asleep or stay asleep. Insomnia can cause fatigue, low energy, difficulty concentrating, mood swings, and poor performance at work or school. There are three different ways to classify insomnia: Difficulty falling asleep. Difficulty staying asleep. Waking up too early in the morning. Any type of insomnia can be long-term (chronic) or short-term (acute). Both are common. Short-term insomnia usually lasts for 3 months or less. Chronic insomnia occurs at least three times a week for longer than 3 months. What are the causes? Insomnia may be caused by another condition, situation, or substance, such as: Having certain mental health conditions, such as anxiety and depression. Using caffeine, alcohol, tobacco, or drugs. Having gastrointestinal conditions, such as gastroesophageal reflux disease (GERD). Having certain medical conditions. These include: Asthma. Alzheimer's disease. Stroke. Chronic pain. An overactive thyroid gland (hyperthyroidism). Other sleep disorders, such as restless legs syndrome and sleep apnea. Menopause. Sometimes, the cause of insomnia may not be known. What increases the risk? Risk factors for insomnia include: Gender. Females are affected more often than males. Age. Insomnia is more common as people get older. Stress and certain medical and mental health conditions. Lack of exercise. Having an irregular work schedule. This may include working night shifts and traveling between different time zones. What are the signs or symptoms? If you have insomnia, the main symptom is having trouble falling asleep or having trouble staying asleep. This may lead to other symptoms, such as: Feeling tired or having low energy. Feeling nervous about going to sleep. Not feeling rested in the morning. Having trouble concentrating. Feeling irritable, anxious, or depressed. How is this diagnosed? This condition  may be diagnosed based on: Your symptoms and medical history. Your health care provider may ask about: Your sleep habits. Any medical conditions you have. Your mental health. A physical exam. How is this treated? Treatment for insomnia depends on the cause. Treatment may focus on treating an underlying condition that is causing the insomnia. Treatment may also include: Medicines to help you sleep. Counseling or therapy. Lifestyle adjustments to help you sleep better. Follow these instructions at home: Eating and drinking  Limit or avoid alcohol, caffeinated beverages, and products that contain nicotine and tobacco, especially close to bedtime. These can disrupt your sleep. Do not eat a large meal or eat spicy foods right before bedtime. This can lead to digestive discomfort that can make it hard for you to sleep. Sleep habits  Keep a sleep diary to help you and your health care provider figure out what could be causing your insomnia. Write down: When you sleep. When you wake up during the night. How well you sleep and how rested you feel the next day. Any side effects of medicines you are taking. What you eat and drink. Make your bedroom a dark, comfortable place where it is easy to fall asleep. Put up shades or blackout curtains to block light from outside. Use a white noise machine to block noise. Keep the temperature cool. Limit screen use before bedtime. This includes: Not watching TV. Not using your smartphone, tablet, or computer. Stick to a routine that includes going to bed and waking up at the same times every day and night. This can help you fall asleep faster. Consider making a quiet activity, such as reading, part of your nighttime routine. Try to avoid taking naps during the day so that you sleep better at night. Get out of bed if you are still awake after   15 minutes of trying to sleep. Keep the lights down, but try reading or doing a quiet activity. When you feel  sleepy, go back to bed. General instructions Take over-the-counter and prescription medicines only as told by your health care provider. Exercise regularly as told by your health care provider. However, avoid exercising in the hours right before bedtime. Use relaxation techniques to manage stress. Ask your health care provider to suggest some techniques that may work well for you. These may include: Breathing exercises. Routines to release muscle tension. Visualizing peaceful scenes. Make sure that you drive carefully. Do not drive if you feel very sleepy. Keep all follow-up visits. This is important. Contact a health care provider if: You are tired throughout the day. You have trouble in your daily routine due to sleepiness. You continue to have sleep problems, or your sleep problems get worse. Get help right away if: You have thoughts about hurting yourself or someone else. Get help right away if you feel like you may hurt yourself or others, or have thoughts about taking your own life. Go to your nearest emergency room or: Call 911. Call the National Suicide Prevention Lifeline at 1-800-273-8255 or 988. This is open 24 hours a day. Text the Crisis Text Line at 741741. Summary Insomnia is a sleep disorder that makes it difficult to fall asleep or stay asleep. Insomnia can be long-term (chronic) or short-term (acute). Treatment for insomnia depends on the cause. Treatment may focus on treating an underlying condition that is causing the insomnia. Keep a sleep diary to help you and your health care provider figure out what could be causing your insomnia. This information is not intended to replace advice given to you by your health care provider. Make sure you discuss any questions you have with your health care provider. Document Revised: 08/21/2021 Document Reviewed: 08/21/2021 Elsevier Patient Education  2023 [employer] Inc.  

## 2022-02-06 NOTE — Progress Notes (Signed)
? ?Subjective:  ? ? Patient ID: Vincent [name], male    DOB: [birth year], 36 y.o.   MRN: [id number] ? ? ?Chief Complaint: medical management of chronic issues  ?  ? ?HPI: ? ?Vincent [name] is a 36 y.o. who identifies as a male who was assigned male at birth.  ? ?Social history: ?Lives with: wife and daughter ?Work history: [employer] ? ? ?Comes in today for follow up of the following chronic medical issues: ? ?1. Essential hypertension ?No c/o chest pain. Sob or headache. Des not check bloodpressure at home. ?BP Readings from Last 3 Encounters:  ?07/27/21 139/81  ?05/30/21 139/84  ?04/18/21 135/83  ? ? ? ?2. Mild intermittent asthma without complication ?No currentl issues. He uses albuterol daily. ? ?3. Hypogonadism in male ?Ison testosterone weekly. ?Lab Results  ?Component Value Date  ? TESTOSTERONE 907 09/27/2021  ? ? ? ?4. Primary insomnia ?Ison trazadone to sleep at night. Sleeps about 8 hours a night.   ? ?5. Anxiety with depression ?Doig well. ? ?  02/06/2022  ?  9:47 AM 07/27/2021  ?  3:39 PM 05/30/2021  ?  3:22 PM  ?Depression screen PHQ 2/9  ?Decreased Interest 0 3 1  ?Down, Depressed, Hopeless 0 2 0  ?PHQ - 2 Score 0 5 1  ?Altered sleeping 0 3 1  ?Tired, decreased energy 0 3 3  ?Change in appetite 0 3 0  ?Feeling bad or failure about yourself  0 0 0  ?Trouble concentrating 0 3 1  ?Moving slowly or fidgety/restless 0 0 0  ?Suicidal thoughts 0 0 0  ?PHQ-9 Score 0 17 6  ?Difficult doing work/chores Not difficult at all Extremely dIfficult Somewhat difficult  ? ? ?  02/06/2022  ?  9:47 AM 07/27/2021  ?  3:40 PM 05/30/2021  ?  3:22 PM 04/18/2021  ?  3:57 PM  ?GAD 7 : Generalized Anxiety Score  ?Nervous, Anxious, on Edge 0 0 0 0  ?Control/stop worrying 0 3 0 0  ?Worry too much - different things 0 3 0 0  ?Trouble relaxing 0 3 1 0  ?Restless 0 3 1 0  ?Easily annoyed or irritable 0 3 0 0  ?Afraid - awful might happen 0 0 0 0  ?Total GAD 7 Score 0 15 2 0  ?Anxiety Difficulty Not difficult at all Extremely difficult  Somewhat difficult   ? ? ? ? ?6. BMI 34.0-34.9,adult ?Weight  is down 17lbs ?Wt Readings from Last 3 Encounters:  ?02/06/22 200 lb (90.7 kg)  ?07/27/21 217 lb 3.2 oz (98.5 kg)  ?05/30/21 202 lb (91.6 kg)  ? ?BMI Readings from Last 3 Encounters:  ?02/06/22 29.53 kg/m?  ?07/27/21 32.07 kg/m?  ?05/30/21 29.83 kg/m?  ? ? ? ?New complaints: ?None today ? ?Allergies  ?Allergen Reactions  ? Tigan [Trimethobenzamide Hcl]   ? ?Outpatient Encounter Medications as of 02/06/2022  ?Medication Sig  ? cetirizine (ZYRTEC ALLERGY) 10 MG tablet Take 1 tablet (10 mg total) by mouth daily.  ? fluticasone (FLONASE) 50 MCG/ACT nasal spray Place 1 spray into both nostrils 2 (two) times daily as needed for allergies or rhinitis.  ? Testosterone Cypionate 200 MG/ML SOLN Inject 0.75 mLs as directed once a week.  ? trazodone (DESYREL) 300 MG tablet TAKE 1 TABLET BY MOUTH EVERYDAY AT BEDTIME  ? [DISCONTINUED] amoxicillin-clavulanate (AUGMENTIN) 875-125 MG tablet Take 1 tablet by mouth 2 (two) times daily.  ? ?No facility-administered encounter medications on file as of 02/06/2022.  ? ? ?  Past Surgical History:  ?Procedure Laterality Date  ? MANDIBLE SURGERY  2009  ? VASECTOMY  2007  ? ? ?Family History  ?Family history unknown: Yes  ? ? ? ? ?Controlled substance contract: n/a ? ? ? ? ?Review of Systems  ?Constitutional:  Negative for diaphoresis.  ?Eyes:  Negative for pain.  ?Respiratory:  Negative for shortness of breath.   ?Cardiovascular:  Negative for chest pain, palpitations and leg swelling.  ?Gastrointestinal:  Negative for abdominal pain.  ?Endocrine: Negative for polydipsia.  ?Skin:  Negative for rash.  ?Neurological:  Negative for dizziness, weakness and headaches.  ?Hematological:  Does not bruise/bleed easily.  ?All other systems reviewed and are negative. ? ?   ?Objective:  ? Physical Exam ?Vitals and nursing note reviewed.  ?Constitutional:   ?   Appearance: Normal appearance. He is well-developed.  ?HENT:  ?   Head: Normocephalic.   ?   Nose: Nose normal.  ?   Mouth/Throat:  ?   Mouth: Mucous membranes are moist.  ?   Pharynx: Oropharynx is clear.  ?Eyes:  ?   Pupils: Pupils are equal, round, and reactive to light.  ?Neck:  ?   Thyroid: No thyroid mass or thyromegaly.  ?   Vascular: No carotid bruit or JVD.  ?   Trachea: Phonation normal.  ?Cardiovascular:  ?   Rate and Rhythm: Normal rate and regular rhythm.  ?Pulmonary:  ?   Effort: Pulmonary effort is normal. No respiratory distress.  ?   Breath sounds: Normal breath sounds.  ?Abdominal:  ?   General: Bowel sounds are normal.  ?   Palpations: Abdomen is soft.  ?   Tenderness: There is no abdominal tenderness.  ?Musculoskeletal:     ?   General: Normal range of motion.  ?   Cervical back: Normal range of motion and neck supple.  ?Lymphadenopathy:  ?   Cervical: No cervical adenopathy.  ?Skin: ?   General: Skin is warm and dry.  ?Neurological:  ?   Mental Status: He is alert and oriented to person, place, and time.  ?Psychiatric:     ?   Behavior: Behavior normal.     ?   Thought Content: Thought content normal.     ?   Judgment: Judgment normal.  ? ? ?BP 129/74   Pulse 70   Temp 98.2 ?F (36.8 ?C) (Temporal)   Resp 20   Ht 5\' 9"  (1.753 m)   Wt 200 lb (90.7 kg)   SpO2 92%   BMI 29.53 kg/m?  ? ? ? ?   ?Assessment & Plan:  ? ?Vincent [name] comes in today with chief complaint of Medical Management of Chronic Issues ? ? ?Diagnosis and orders addressed: ? ?1. Essential hypertension ?Low sodium diet ?- lisinopril (ZESTRIL) 10 MG tablet; Take 1 tablet (10 mg total) by mouth daily.  Dispense: 90 tablet; Refill: 1 ? ?2. Mild intermittent asthma without complication ?Added breo daily ?- fluticasone furoate-vilanterol (BREO ELLIPTA) 100-25 MCG/ACT AEPB; Inhale 1 puff into the lungs daily.  Dispense: 1 each; Refill: 11 ?- albuterol (VENTOLIN HFA) 108 (90 Base) MCG/ACT inhaler; Inhale 2 puffs into the lungs every 6 (six) hours as needed for wheezing or shortness of breath.  Dispense: 8 g; Refill:  0 ?- cetirizine (ZYRTEC ALLERGY) 10 MG tablet; Take 1 tablet (10 mg total) by mouth daily.  Dispense: 90 tablet; Refill: 1 ?- fluticasone (FLONASE) 50 MCG/ACT nasal spray; Place 1 spray into both nostrils 2 (two)  times daily as needed for allergies or rhinitis.  Dispense: 16 g; Refill: 6 ? ?3. Hypogonadism in male ?- Testosterone Cypionate 200 MG/ML SOLN; Inject 0.75 mLs as directed once a week.  Dispense: 13 mL; Refill: 1 ? ?4. Primary insomnia ?Bedtime routine ?- trazodone (DESYREL) 300 MG tablet; Take 1 tablet (300 mg total) by mouth at bedtime.  Dispense: 90 tablet; Refill: 1 ? ?5. Anxiety with depression ?Stress management ? ?6. BMI 34.0-34.9,adult ?Discussed diet and exercise for person with BMI >25 ?Will recheck weight in 3-6 months ? ? ? ?Labs pending ?Health Maintenance reviewed ?Diet and exercise encouraged ? ?Follow up plan: ?6 months ? ? ?Mary-Margaret Daphine Deutscher, FNP ? ?

## 2022-02-12 ENCOUNTER — Other Ambulatory Visit: Payer: 59

## 2022-02-12 DIAGNOSIS — E291 Testicular hypofunction: Secondary | ICD-10-CM

## 2022-02-13 LAB — TESTOSTERONE,FREE AND TOTAL
Testosterone, Free: 39.2 pg/mL — ABNORMAL HIGH (ref 8.7–25.1)
Testosterone: >1500 ng/dL — ABNORMAL HIGH (ref 264–916)

## 2022-02-16 ENCOUNTER — Telehealth: Payer: Self-pay | Admitting: Nurse Practitioner

## 2022-02-16 ENCOUNTER — Ambulatory Visit (INDEPENDENT_AMBULATORY_CARE_PROVIDER_SITE_OTHER): Payer: 59 | Admitting: Nurse Practitioner

## 2022-02-16 ENCOUNTER — Encounter: Payer: Self-pay | Admitting: Nurse Practitioner

## 2022-02-16 ENCOUNTER — Other Ambulatory Visit: Payer: Self-pay | Admitting: Family Medicine

## 2022-02-16 DIAGNOSIS — E291 Testicular hypofunction: Secondary | ICD-10-CM | POA: Diagnosis not present

## 2022-02-16 MED ORDER — MELOXICAM 15 MG PO TABS
15.0000 mg | ORAL_TABLET | Freq: Every day | ORAL | 1 refills | Status: DC
Start: 2022-02-16 — End: 2022-08-09

## 2022-02-16 MED ORDER — TESTOSTERONE CYPIONATE 200 MG/ML IJ SOLN: 0.7500 mL | INTRAMUSCULAR | 1 refills | Status: DC

## 2022-02-16 NOTE — Telephone Encounter (Signed)
Testosteone refilled

## 2022-02-16 NOTE — Progress Notes (Signed)
   Virtual Visit  Note Due to COVID-19 pandemic this visit was conducted virtually. This visit type was conducted due to national recommendations for restrictions regarding the COVID-19 Pandemic (e.g. social distancing, sheltering in place) in an effort to limit this patient's exposure and mitigate transmission in our community. All issues noted in this document were discussed and addressed.  A physical exam was not performed with this format.  I connected with [name] on 02/16/22 at 10:23 by telephone and verified that I am speaking with the correct person using two identifiers. [name] is currently located at home and no one is currently with him during visit. The provider, Mary-Margaret Daphine Deutscher, FNP is located in their office at time of visit.  I discussed the limitations, risks, security and privacy concerns of performing an evaluation and management service by telephone and the availability of in person appointments. I also discussed with the patient that there may be a patient responsible charge related to this service. The patient expressed understanding and agreed to proceed.   History and Present Illness:  Patient was concerned about his testosterone level which was elevated, he has been given his testosterone weekly. He says he feels fine.     Review of Systems  Constitutional:  Negative for diaphoresis and weight loss.  Eyes:  Negative for blurred vision, double vision and pain.  Respiratory:  Negative for shortness of breath.   Cardiovascular:  Negative for chest pain, palpitations, orthopnea and leg swelling.  Gastrointestinal:  Negative for abdominal pain.  Skin:  Negative for rash.  Neurological:  Negative for dizziness, sensory change, loss of consciousness, weakness and headaches.  Endo/Heme/Allergies:  Negative for polydipsia. Does not bruise/bleed easily.  Psychiatric/Behavioral:  Negative for memory loss. The patient does not have insomnia.   All other  systems reviewed and are negative.   Observations/Objective: Alert and oriented- answers all questions appropriately No distress    Assessment and Plan: [name] in today with chief complaint of No chief complaint on file.   1. Hypogonadism in male Change and do testoaterone injections every 14 days Repeat labs in 1  month  - Testosterone,Free and Total; Future  Refilled mobic today  Follow Up Instructions: Repeat labs in 1 month    I discussed the assessment and treatment plan with the patient. The patient was provided an opportunity to ask questions and all were answered. The patient agreed with the plan and demonstrated an understanding of the instructions.   The patient was advised to call back or seek an in-person evaluation if the symptoms worsen or if the condition fails to improve as anticipated.  The above assessment and management plan was discussed with the patient. The patient verbalized understanding of and has agreed to the management plan. Patient is aware to call the clinic if symptoms persist or worsen. Patient is aware when to return to the clinic for a follow-up visit. Patient educated on when it is appropriate to go to the emergency department.   Time call ended:  10:35  I provided 12 minutes of  non face-to-face time during this encounter.    Mary-Margaret Daphine Deutscher, FNP

## 2022-02-28 ENCOUNTER — Telehealth: Payer: Self-pay | Admitting: Nurse Practitioner

## 2022-02-28 NOTE — Telephone Encounter (Signed)
Called and discuss labs  He will wait a little longer before labs

## 2022-03-10 ENCOUNTER — Other Ambulatory Visit: Payer: Self-pay | Admitting: Nurse Practitioner

## 2022-03-10 DIAGNOSIS — J4520 Mild intermittent asthma, uncomplicated: Secondary | ICD-10-CM

## 2022-04-19 ENCOUNTER — Encounter: Payer: Self-pay | Admitting: Family Medicine

## 2022-04-19 ENCOUNTER — Ambulatory Visit (INDEPENDENT_AMBULATORY_CARE_PROVIDER_SITE_OTHER): Payer: 59 | Admitting: Family Medicine

## 2022-04-19 DIAGNOSIS — L282 Other prurigo: Secondary | ICD-10-CM

## 2022-04-19 MED ORDER — PREDNISONE 10 MG (21) PO TBPK: ORAL_TABLET | ORAL | 0 refills | Status: DC

## 2022-04-19 NOTE — Progress Notes (Signed)
Virtual Visit via telephone Note Due to COVID-19 pandemic this visit was conducted virtually. This visit type was conducted due to national recommendations for restrictions regarding the COVID-19 Pandemic (e.g. social distancing, sheltering in place) in an effort to limit this patient's exposure and mitigate transmission in our community. All issues noted in this document were discussed and addressed.  A physical exam was not performed with this format.   I connected with Vincent [name] on 04/19/2022 at 0800 by telephone and verified that I am speaking with the correct person using two identifiers. Vincent [name] is currently located at home and patient is currently with them during visit. The provider, Kari Baars, FNP is located in their office at time of visit.  I discussed the limitations, risks, security and privacy concerns of performing an evaluation and management service by virtual visit and the availability of in person appointments. I also discussed with the patient that there may be a patient responsible charge related to this service. The patient expressed understanding and agreed to proceed.  Subjective:  Patient ID: Vincent [name], male    DOB: Apr 24, 1986, 36 y.o.   MRN: [id number]  Chief Complaint:  Rash   HPI: Vincent [name] is a 36 y.o. male presenting on 04/19/2022 for Rash   Pt reports onset of pruritic rash to bilateral ankles after weedeating on Friday, states rash has progressively gotten worse since then despite treatment at home.   Rash This is a new problem. The current episode started in the past 7 days. The problem has been gradually worsening since onset. The affected locations include the left ankle, left foot, right ankle, right foot, right lower leg, left lower leg, left arm, right arm, right wrist, right hand and left elbow. The rash is characterized by itchiness and redness. He was exposed to plant contact (weedeating Friday). Pertinent negatives  include no anorexia, congestion, cough, diarrhea, eye pain, facial edema, fatigue, fever, joint pain, nail changes, rhinorrhea, shortness of breath, sore throat or vomiting. Past treatments include cold compress, antihistamine and anti-itch cream. The treatment provided no relief.     Relevant past medical, surgical, family, and social history reviewed and updated as indicated.  Allergies and medications reviewed and updated.   Past Medical History:  Diagnosis Date   ADHD (attention deficit hyperactivity disorder)    Anxiety    panic    Asthma     Past Surgical History:  Procedure Laterality Date   MANDIBLE SURGERY  2009   VASECTOMY  2007    Social History   Socioeconomic History   Marital status: Married    Spouse name: Not on file   Number of children: Not on file   Years of education: Not on file   Highest education level: Not on file  Occupational History   Not on file  Tobacco Use   Smoking status: Former    Packs/day: 0.50    Types: Cigarettes   Smokeless tobacco: Never  Vaping Use   Vaping Use: Never used  Substance and Sexual Activity   Alcohol use: No   Drug use: No   Sexual activity: Yes    Birth control/protection: Surgical  Other Topics Concern   Not on file  Social History Narrative   Patient is a Curator   He is married   Social Determinants of Corporate investment banker Strain: Not on file  Food Insecurity: Not on file  Transportation Needs: Not on file  Physical Activity: Not on  file  Stress: Not on file  Social Connections: Not on file  Intimate Partner Violence: Not on file    Outpatient Encounter Medications as of 04/19/2022  Medication Sig   predniSONE (STERAPRED UNI-PAK 21 TAB) 10 MG (21) TBPK tablet As directed x 6 days   albuterol (VENTOLIN HFA) 108 (90 Base) MCG/ACT inhaler INHALE 2 PUFFS INTO THE LUNGS EVERY 6 HOURS AS NEEDED FOR WHEEZE OR SHORTNESS OF BREATH   cetirizine (ZYRTEC ALLERGY) 10 MG tablet Take 1 tablet (10 mg  total) by mouth daily.   fluticasone (FLONASE) 50 MCG/ACT nasal spray Place 1 spray into both nostrils 2 (two) times daily as needed for allergies or rhinitis.   fluticasone furoate-vilanterol (BREO ELLIPTA) 100-25 MCG/ACT AEPB Inhale 1 puff into the lungs daily.   lisinopril (ZESTRIL) 10 MG tablet Take 1 tablet (10 mg total) by mouth daily.   meloxicam (MOBIC) 15 MG tablet Take 1 tablet (15 mg total) by mouth daily.   Testosterone Cypionate 200 MG/ML SOLN Inject 0.75 mLs as directed every 14 (fourteen) days.   trazodone (DESYREL) 300 MG tablet Take 1 tablet (300 mg total) by mouth at bedtime.   No facility-administered encounter medications on file as of 04/19/2022.    Allergies  Allergen Reactions   Tigan [Trimethobenzamide Hcl]     Review of Systems  Constitutional:  Negative for activity change, appetite change, chills, diaphoresis, fatigue, fever and unexpected weight change.  HENT:  Negative for congestion, rhinorrhea, sore throat, trouble swallowing and voice change.   Eyes:  Negative for photophobia, pain and visual disturbance.  Respiratory:  Negative for apnea, cough, choking, chest tightness, shortness of breath, wheezing and stridor.   Cardiovascular:  Negative for chest pain, palpitations and leg swelling.  Gastrointestinal:  Negative for abdominal pain, anorexia, diarrhea, nausea and vomiting.  Musculoskeletal:  Negative for joint pain.  Skin:  Positive for color change and rash. Negative for nail changes, pallor and wound.  Neurological:  Negative for dizziness, weakness, light-headedness and headaches.  Psychiatric/Behavioral:  Negative for confusion.   All other systems reviewed and are negative.        Observations/Objective: No vital signs or physical exam, this was a virtual health encounter.  Pt alert and oriented, answers all questions appropriately, and able to speak in full sentences.    Assessment and Plan: Rayn was seen today for rash.  Diagnoses  and all orders for this visit:  Pruritic rash Reported symptoms consistent with poison ivy dermatitis. Discussed symptomatic care in detail. Due to diffuse spread, will treat with oral steroids. No indications of anaphylaxis. Report new, worsening, or persistent symptoms. Follow up as needed.  -     predniSONE (STERAPRED UNI-PAK 21 TAB) 10 MG (21) TBPK tablet; As directed x 6 days     Follow Up Instructions: Return if symptoms worsen or fail to improve.    I discussed the assessment and treatment plan with the patient. The patient was provided an opportunity to ask questions and all were answered. The patient agreed with the plan and demonstrated an understanding of the instructions.   The patient was advised to call back or seek an in-person evaluation if the symptoms worsen or if the condition fails to improve as anticipated.  The above assessment and management plan was discussed with the patient. The patient verbalized understanding of and has agreed to the management plan. Patient is aware to call the clinic if they develop any new symptoms or if symptoms persist or worsen. Patient is  aware when to return to the clinic for a follow-up visit. Patient educated on when it is appropriate to go to the emergency department.    I provided 12 minutes of time during this telephone encounter.   Kari Baars, FNP-C [facility] [address] [place], [place] [address] [phone] 04/19/2022

## 2022-05-04 ENCOUNTER — Other Ambulatory Visit: Payer: 59

## 2022-05-04 DIAGNOSIS — E291 Testicular hypofunction: Secondary | ICD-10-CM

## 2022-05-08 ENCOUNTER — Telehealth: Payer: Self-pay | Admitting: Nurse Practitioner

## 2022-05-08 NOTE — Telephone Encounter (Signed)
Please review results and advise.

## 2022-05-08 NOTE — Telephone Encounter (Signed)
Results not back as of Tuesday at 4:55

## 2022-05-08 NOTE — Telephone Encounter (Signed)
Patient aware that we are still waiting on results and will call him when we receive.

## 2022-05-10 ENCOUNTER — Telehealth: Payer: Self-pay | Admitting: Nurse Practitioner

## 2022-05-10 DIAGNOSIS — E291 Testicular hypofunction: Secondary | ICD-10-CM

## 2022-05-10 NOTE — Telephone Encounter (Signed)
Testosterone is in normal range.

## 2022-05-10 NOTE — Telephone Encounter (Signed)
Lmtcb.

## 2022-05-11 ENCOUNTER — Encounter: Payer: Self-pay | Admitting: Family Medicine

## 2022-05-11 LAB — TESTOSTERONE,FREE AND TOTAL
Testosterone, Free: 8.6 pg/mL — ABNORMAL LOW (ref 8.7–25.1)
Testosterone: 433 ng/dL (ref 264–916)

## 2022-05-11 NOTE — Telephone Encounter (Signed)
[email or web address]

## 2022-05-11 NOTE — Telephone Encounter (Signed)
Pt rc for results.

## 2022-05-14 NOTE — Progress Notes (Signed)
Hello Vincent [name],  Your lab result is normal and/or stable.Some minor variations that are not significant are commonly marked abnormal, but do not represent any medical problem for you.  Best regards, Mechele Claude, M.D.

## 2022-05-14 NOTE — Telephone Encounter (Signed)
Your level came back 433. That is a reasonable level.

## 2022-05-15 ENCOUNTER — Other Ambulatory Visit: Payer: Self-pay | Admitting: Family Medicine

## 2022-05-15 DIAGNOSIS — E291 Testicular hypofunction: Secondary | ICD-10-CM

## 2022-05-15 MED ORDER — TESTOSTERONE CYPIONATE 200 MG/ML IJ SOLN: 0.7500 mL | INTRAMUSCULAR | 1 refills | Status: DC

## 2022-05-17 NOTE — Telephone Encounter (Signed)
Patient notified of lab results. He states that when he was seeing Dr Darlyn Read that Dr Darlyn Read always told him he liked to keep his testosterone level in the 900s and he felt much better then.  Can you adjust his med to do that? Please advise

## 2022-05-18 MED ORDER — TESTOSTERONE CYPIONATE 200 MG/ML IM SOLN: 300.0000 mg | INTRAMUSCULAR | 5 refills | Status: DC

## 2022-05-18 NOTE — Telephone Encounter (Signed)
Patient aware and verbalized understanding. °

## 2022-05-18 NOTE — Telephone Encounter (Signed)
Testosterone injection increased to 1.54ml every 14 days

## 2022-05-18 NOTE — Addendum Note (Signed)
Addended by: Bennie Pierini on: 05/18/2022 09:48 AM   Modules accepted: Orders

## 2022-06-09 ENCOUNTER — Other Ambulatory Visit: Payer: Self-pay | Admitting: Nurse Practitioner

## 2022-06-09 DIAGNOSIS — F5101 Primary insomnia: Secondary | ICD-10-CM

## 2022-07-30 ENCOUNTER — Ambulatory Visit: Payer: 59 | Admitting: Nurse Practitioner

## 2022-08-09 ENCOUNTER — Other Ambulatory Visit: Payer: Self-pay | Admitting: Nurse Practitioner

## 2022-08-09 DIAGNOSIS — I10 Essential (primary) hypertension: Secondary | ICD-10-CM

## 2022-08-10 ENCOUNTER — Other Ambulatory Visit: Payer: Self-pay | Admitting: Nurse Practitioner

## 2022-08-10 DIAGNOSIS — F5101 Primary insomnia: Secondary | ICD-10-CM

## 2022-08-21 ENCOUNTER — Ambulatory Visit (INDEPENDENT_AMBULATORY_CARE_PROVIDER_SITE_OTHER): Payer: 59 | Admitting: Nurse Practitioner

## 2022-08-21 ENCOUNTER — Encounter: Payer: Self-pay | Admitting: Nurse Practitioner

## 2022-08-21 VITALS — BP 145/88 | HR 89 | Temp 98.1°F | Resp 20 | Ht 69.0 in | Wt 227.0 lb

## 2022-08-21 DIAGNOSIS — K5720 Diverticulitis of large intestine with perforation and abscess without bleeding: Secondary | ICD-10-CM | POA: Diagnosis not present

## 2022-08-21 DIAGNOSIS — K5730 Diverticulosis of large intestine without perforation or abscess without bleeding: Secondary | ICD-10-CM

## 2022-08-21 MED ORDER — METRONIDAZOLE 500 MG PO TABS: 500.0000 mg | ORAL_TABLET | Freq: Two times a day (BID) | ORAL | 0 refills | Status: DC

## 2022-08-21 MED ORDER — CIPROFLOXACIN HCL 500 MG PO TABS: 500.0000 mg | ORAL_TABLET | Freq: Two times a day (BID) | ORAL | 0 refills | Status: DC

## 2022-08-21 NOTE — Progress Notes (Signed)
   Subjective:    Patient ID: Vincent [name], male    DOB: October 14, 1985, 36 y.o.   MRN: [id number]   Chief Complaint: Hospitalization Follow-up (/)   HPI Patient went to urgent Care, with abdominal pain. They dx him with diverticuitis and they did not give him anything. He is still having abdominal pain and has had a low grade ever. His wife had some augmentin and he has taken 4 of those.     Review of Systems  Constitutional:  Negative for diaphoresis.  Eyes:  Negative for pain.  Respiratory:  Negative for shortness of breath.   Cardiovascular:  Negative for chest pain, palpitations and leg swelling.  Gastrointestinal:  Positive for abdominal pain.  Endocrine: Negative for polydipsia.  Skin:  Negative for rash.  Neurological:  Negative for dizziness, weakness and headaches.  Hematological:  Does not bruise/bleed easily.  All other systems reviewed and are negative.      Objective:   Physical Exam Vitals reviewed.  Constitutional:      Appearance: Normal appearance.  Cardiovascular:     Rate and Rhythm: Normal rate and regular rhythm.     Heart sounds: Normal heart sounds.  Pulmonary:     Effort: Pulmonary effort is normal.     Breath sounds: Normal breath sounds.  Abdominal:     Tenderness: There is abdominal tenderness (LLQ on palpation).  Skin:    General: Skin is warm.  Neurological:     General: No focal deficit present.     Mental Status: He is alert and oriented to person, place, and time.  Psychiatric:        Mood and Affect: Mood normal.        Behavior: Behavior normal.    BP (!) 145/88   Pulse 89   Temp 98.1 F (36.7 C) (Temporal)   Resp 20   Ht 5\' 9"  (1.753 m)   Wt 227 lb (103 kg)   SpO2 95%   BMI 33.52 kg/m         Assessment & Plan:   in today with chief complaint of Hospitalization Follow-up (/)   1. Diverticulitis of large intestine with perforation without abscess or bleeding Watch diet to prevent flare  up-  handout given - ciprofloxacin (CIPRO) 500 MG tablet; Take 1 tablet (500 mg total) by mouth 2 (two) times daily.  Dispense: 10 tablet; Refill: 0 - metroNIDAZOLE (FLAGYL) 500 MG tablet; Take 1 tablet (500 mg total) by mouth 2 (two) times daily.  Dispense: 14 tablet; Refill: 0    The above assessment and management plan was discussed with the patient. The patient verbalized understanding of and has agreed to the management plan. Patient is aware to call the clinic if symptoms persist or worsen. Patient is aware when to return to the clinic for a follow-up visit. Patient educated on when it is appropriate to go to the emergency department.   Vincent Vincent Fennel, FNP

## 2022-08-24 ENCOUNTER — Ambulatory Visit: Payer: 59 | Admitting: Nurse Practitioner

## 2022-09-19 ENCOUNTER — Encounter: Payer: Self-pay | Admitting: Nurse Practitioner

## 2022-09-19 ENCOUNTER — Telehealth (INDEPENDENT_AMBULATORY_CARE_PROVIDER_SITE_OTHER): Payer: 59 | Admitting: Nurse Practitioner

## 2022-09-19 DIAGNOSIS — K591 Functional diarrhea: Secondary | ICD-10-CM

## 2022-09-19 DIAGNOSIS — R112 Nausea with vomiting, unspecified: Secondary | ICD-10-CM | POA: Diagnosis not present

## 2022-09-19 MED ORDER — ONDANSETRON HCL 4 MG PO TABS: 4.0000 mg | ORAL_TABLET | Freq: Three times a day (TID) | ORAL | 0 refills | Status: DC | PRN

## 2022-09-19 NOTE — Progress Notes (Signed)
Virtual Visit Consent   Vincent [name], you are scheduled for a virtual visit with Mary-Margaret Daphine Deutscher, FNP, a [facility] provider, today.     Just as with appointments in the office, your consent must be obtained to participate.  Your consent will be active for this visit and any virtual visit you may have with one of our providers in the next 365 days.     If you have a MyChart account, a copy of this consent can be sent to you electronically.  All virtual visits are billed to your insurance company just like a traditional visit in the office.    As this is a virtual visit, video technology does not allow for your provider to perform a traditional examination.  This may limit your provider's ability to fully assess your condition.  If your provider identifies any concerns that need to be evaluated in person or the need to arrange testing (such as labs, EKG, etc.), we will make arrangements to do so.     Although advances in technology are sophisticated, we cannot ensure that it will always work on either your end or our end.  If the connection with a video visit is poor, the visit may have to be switched to a telephone visit.  With either a video or telephone visit, we are not always able to ensure that we have a secure connection.     I need to obtain your verbal consent now.   Are you willing to proceed with your visit today? YES   Vincent [name] has provided verbal consent on 09/19/2022 for a virtual visit (video or telephone).   Mary-Margaret Daphine Deutscher, FNP   Date: 09/19/2022 10:24 AM   Virtual Visit via Video Note   I, Mary-Margaret Daphine Deutscher, connected with Vincent [name] ([id number], 05-27-1986) on 09/19/22 at  6:15 PM EST by a video-enabled telemedicine application and verified that I am speaking with the correct person using two identifiers.  Location: Patient: Virtual Visit Location Patient: Home Provider: Virtual Visit Location Provider: Mobile   I discussed the  limitations of evaluation and management by telemedicine and the availability of in person appointments. The patient expressed understanding and agreed to proceed.    History of Present Illness: Vincent [name] is a 36 y.o. who identifies as a male who was assigned male at birth, and is being seen today for nausea and vomiting.  HPI: Emesis  This is a new problem. The current episode started yesterday. The problem occurs less than 2 times per day. The problem has been waxing and waning. The emesis has an appearance of stomach contents. There has been no fever. Associated symptoms include diarrhea. Pertinent negatives include no abdominal pain. Treatments tried: emitrol. The treatment provided mild relief.    Review of Systems  Gastrointestinal:  Positive for diarrhea and vomiting. Negative for abdominal pain.    Problems:  Patient Active Problem List   Diagnosis Date Noted   Diverticulosis large intestine w/o perforation or abscess w/o bleeding 08/21/2022   Anxiety with depression 07/30/2021   Hypogonadism in male 11/21/2020   Essential hypertension 09/23/2019   BMI 34.0-34.9,adult 09/23/2019   Insomnia 09/23/2019   Controlled substance agreement signed 09/23/2019   Attention deficit hyperactivity disorder (ADHD), predominantly inattentive type 08/26/2018   Mild intermittent asthma without complication 08/26/2018   Other idiopathic scoliosis, thoracolumbar region 05/29/2017    Allergies:  Allergies  Allergen Reactions   Tigan [Trimethobenzamide Hcl]    Medications:  Current Outpatient  Medications:    albuterol (VENTOLIN HFA) 108 (90 Base) MCG/ACT inhaler, INHALE 2 PUFFS INTO THE LUNGS EVERY 6 HOURS AS NEEDED FOR WHEEZE OR SHORTNESS OF BREATH, Disp: 8.5 each, Rfl: 2   cetirizine (ZYRTEC ALLERGY) 10 MG tablet, Take 1 tablet (10 mg total) by mouth daily., Disp: 90 tablet, Rfl: 1   ciprofloxacin (CIPRO) 500 MG tablet, Take 1 tablet (500 mg total) by mouth 2 (two) times daily., Disp:  10 tablet, Rfl: 0   lisinopril (ZESTRIL) 10 MG tablet, TAKE 1 TABLET BY MOUTH EVERY DAY, Disp: 90 tablet, Rfl: 0   meloxicam (MOBIC) 15 MG tablet, TAKE 1 TABLET (15 MG TOTAL) BY MOUTH DAILY., Disp: 90 tablet, Rfl: 1   metroNIDAZOLE (FLAGYL) 500 MG tablet, Take 1 tablet (500 mg total) by mouth 2 (two) times daily., Disp: 14 tablet, Rfl: 0   testosterone cypionate (DEPOTESTOSTERONE CYPIONATE) 200 MG/ML injection, Inject 1.5 mLs (300 mg total) into the muscle every 14 (fourteen) days., Disp: 3 mL, Rfl: 5   trazodone (DESYREL) 300 MG tablet, TAKE 1 TABLET BY MOUTH EVERYDAY AT BEDTIME, Disp: 90 tablet, Rfl: 0  Observations/Objective: Patient is well-developed, well-nourished in no acute distress.  Resting comfortably  at home.  Head is normocephalic, atraumatic.  No labored breathing.  Speech is clear and coherent with logical content.  Patient is alert and oriented at baseline.    Assessment and Plan:  Vincent [name] in today with chief complaint of Nausea   1. Nausea and vomiting, unspecified vomiting type  2. Functional diarrhea First 24 Hours-Clear liquids  popsicles  Jello  gatorade  Sprite Second 24 hours-Add Full liquids ( Liquids you cant see through) Third 24 hours- Bland diet ( foods that are baked or broiled)  *avoiding fried foods and highly spiced foods* During these 3 days  Avoid milk, cheese, ice cream or any other dairy products  Avoid caffeine- REMEMBER Mt. Dew and Mello Yellow contain lots of caffeine You should eat and drink in  Frequent small volumes If no improvement in symptoms or worsen in 2-3 days should RETRUN TO OFFICE or go to ER!   Meds ordered this encounter  Medications   ondansetron (ZOFRAN) 4 MG tablet    Sig: Take 1 tablet (4 mg total) by mouth every 8 (eight) hours as needed for nausea or vomiting.    Dispense:  20 tablet    Refill:  0    Order Specific Question:   Supervising Provider    Answer:   Arville Care A [[id number]]        Follow Up Instructions: I discussed the assessment and treatment plan with the patient. The patient was provided an opportunity to ask questions and all were answered. The patient agreed with the plan and demonstrated an understanding of the instructions.  A copy of instructions were sent to the patient via MyChart.  The patient was advised to call back or seek an in-person evaluation if the symptoms worsen or if the condition fails to improve as anticipated.  Time:  I spent 5 minutes with the patient via telehealth technology discussing the above problems/concerns.    Mary-Margaret Daphine Deutscher, FNP

## 2022-09-19 NOTE — Patient Instructions (Signed)

## 2022-10-01 ENCOUNTER — Other Ambulatory Visit: Payer: Self-pay | Admitting: Nurse Practitioner

## 2022-10-01 DIAGNOSIS — F5101 Primary insomnia: Secondary | ICD-10-CM

## 2022-10-06 ENCOUNTER — Other Ambulatory Visit: Payer: Self-pay | Admitting: Nurse Practitioner

## 2022-10-06 DIAGNOSIS — I10 Essential (primary) hypertension: Secondary | ICD-10-CM

## 2022-10-09 ENCOUNTER — Ambulatory Visit: Payer: 59 | Admitting: Nurse Practitioner

## 2022-10-22 ENCOUNTER — Ambulatory Visit: Payer: 59 | Admitting: Nurse Practitioner

## 2022-11-01 ENCOUNTER — Telehealth: Payer: Self-pay | Admitting: Nurse Practitioner

## 2022-11-01 NOTE — Telephone Encounter (Signed)
Needs to be in person 

## 2022-11-01 NOTE — Telephone Encounter (Signed)
Pt wants to know if his Chronic follow Up that's scheduled for tomorrow can be a video visit? Last in person visit was in May 2023. Please advise.   Pt says if the appt needs to be in person then he will have to r/s due to work and would need refill on his Testosteone called in to last him until he can come in for the appt.

## 2022-11-02 ENCOUNTER — Ambulatory Visit: Payer: 59 | Admitting: Nurse Practitioner

## 2022-11-05 ENCOUNTER — Other Ambulatory Visit: Payer: Self-pay | Admitting: Nurse Practitioner

## 2022-11-05 DIAGNOSIS — E291 Testicular hypofunction: Secondary | ICD-10-CM

## 2022-11-09 ENCOUNTER — Encounter: Payer: Self-pay | Admitting: Nurse Practitioner

## 2022-11-09 ENCOUNTER — Ambulatory Visit (INDEPENDENT_AMBULATORY_CARE_PROVIDER_SITE_OTHER): Payer: 59 | Admitting: Nurse Practitioner

## 2022-11-09 VITALS — BP 116/77 | HR 89 | Temp 98.4°F | Resp 20 | Ht 69.0 in | Wt 223.0 lb

## 2022-11-09 DIAGNOSIS — F5101 Primary insomnia: Secondary | ICD-10-CM | POA: Diagnosis not present

## 2022-11-09 DIAGNOSIS — E291 Testicular hypofunction: Secondary | ICD-10-CM

## 2022-11-09 DIAGNOSIS — K5730 Diverticulosis of large intestine without perforation or abscess without bleeding: Secondary | ICD-10-CM

## 2022-11-09 DIAGNOSIS — I10 Essential (primary) hypertension: Secondary | ICD-10-CM

## 2022-11-09 DIAGNOSIS — F418 Other specified anxiety disorders: Secondary | ICD-10-CM

## 2022-11-09 MED ORDER — LISINOPRIL 10 MG PO TABS: 10.0000 mg | ORAL_TABLET | Freq: Every day | ORAL | 1 refills | Status: DC

## 2022-11-09 MED ORDER — TESTOSTERONE CYPIONATE 200 MG/ML IM SOLN: 300.0000 mg | INTRAMUSCULAR | 0 refills | Status: DC

## 2022-11-09 MED ORDER — TRAZODONE HCL 300 MG PO TABS: ORAL_TABLET | ORAL | 1 refills | Status: DC

## 2022-11-09 NOTE — Progress Notes (Signed)
Subjective:    Patient ID: Vincent [name], male    DOB: December 13, 1985, 37 y.o.   MRN: [id number]   Chief Complaint: Medical Management of Chronic Issues    HPI:  Vincent [name] is a 37 y.o. who identifies as a male who was assigned male at birth.   Social history: Lives with: wife and daughter Work history: [employer]   Comes in today for follow up of the following chronic medical issues:  1. Essential hypertension No c/o chest pain, sob or headache. Does not check blood pressure at home BP Readings from Last 3 Encounters:  11/09/22 116/77  08/21/22 (!) 145/88  02/06/22 129/74     2. Anxiety with depression Has history of anxiety and depression. He is much better and is on no medication.   3. Diverticulosis large intestine w/o perforation or abscess w/o bleeding Had flare up in November. No problems after completing treatment.  4. Primary insomnia Takes trazadone to sleep and sleep about 7-8 hours a night.  5. Hypogonadism in male Is on testosterone and is doing well. Lab Results  Component Value Date   TESTOSTERONE 433 05/04/2022      New complaints: None today  Allergies  Allergen Reactions   Tigan [Trimethobenzamide Hcl]    Outpatient Encounter Medications as of 11/09/2022  Medication Sig   albuterol (VENTOLIN HFA) 108 (90 Base) MCG/ACT inhaler INHALE 2 PUFFS INTO THE LUNGS EVERY 6 HOURS AS NEEDED FOR WHEEZE OR SHORTNESS OF BREATH   cetirizine (ZYRTEC ALLERGY) 10 MG tablet Take 1 tablet (10 mg total) by mouth daily.   lisinopril (ZESTRIL) 10 MG tablet TAKE 1 TABLET BY MOUTH EVERY DAY   meloxicam (MOBIC) 15 MG tablet TAKE 1 TABLET (15 MG TOTAL) BY MOUTH DAILY.   ondansetron (ZOFRAN) 4 MG tablet Take 1 tablet (4 mg total) by mouth every 8 (eight) hours as needed for nausea or vomiting.   testosterone cypionate (DEPOTESTOSTERONE CYPIONATE) 200 MG/ML injection INJECT 1.5 MLS (300 MG TOTAL) INTO THE MUSCLE EVERY 14 (FOURTEEN) DAYS.   trazodone  (DESYREL) 300 MG tablet TAKE 1 TABLET BY MOUTH EVERYDAY AT BEDTIME   [DISCONTINUED] ciprofloxacin (CIPRO) 500 MG tablet Take 1 tablet (500 mg total) by mouth 2 (two) times daily.   [DISCONTINUED] metroNIDAZOLE (FLAGYL) 500 MG tablet Take 1 tablet (500 mg total) by mouth 2 (two) times daily.   No facility-administered encounter medications on file as of 11/09/2022.    Past Surgical History:  Procedure Laterality Date   MANDIBLE SURGERY  2009   VASECTOMY  2007    Family History  Family history unknown: Yes      Controlled substance contract: n/a     Review of Systems  Constitutional:  Negative for diaphoresis.  Eyes:  Negative for pain.  Respiratory:  Negative for shortness of breath.   Cardiovascular:  Negative for chest pain, palpitations and leg swelling.  Gastrointestinal:  Negative for abdominal pain.  Endocrine: Negative for polydipsia.  Skin:  Negative for rash.  Neurological:  Negative for dizziness, weakness and headaches.  Hematological:  Does not bruise/bleed easily.  All other systems reviewed and are negative.      Objective:   Physical Exam Vitals and nursing note reviewed.  Constitutional:      Appearance: Normal appearance. He is well-developed.  HENT:     Head: Normocephalic.     Nose: Nose normal.     Mouth/Throat:     Mouth: Mucous membranes are moist.     Pharynx:  Oropharynx is clear.  Eyes:     Pupils: Pupils are equal, round, and reactive to light.  Neck:     Thyroid: No thyroid mass or thyromegaly.     Vascular: No carotid bruit or JVD.     Trachea: Phonation normal.  Cardiovascular:     Rate and Rhythm: Normal rate and regular rhythm.  Pulmonary:     Effort: Pulmonary effort is normal. No respiratory distress.     Breath sounds: Normal breath sounds.  Abdominal:     General: Bowel sounds are normal.     Palpations: Abdomen is soft.     Tenderness: There is no abdominal tenderness.  Musculoskeletal:        General: Normal range of  motion.     Cervical back: Normal range of motion and neck supple.  Lymphadenopathy:     Cervical: No cervical adenopathy.  Skin:    General: Skin is warm and dry.  Neurological:     Mental Status: He is alert and oriented to person, place, and time.  Psychiatric:        Behavior: Behavior normal.        Thought Content: Thought content normal.        Judgment: Judgment normal.    BP 116/77   Pulse 89   Temp 98.4 F (36.9 C) (Temporal)   Resp 20   Ht 5' 9"$  (1.753 m)   Wt 223 lb (101.2 kg)   SpO2 93%   BMI 32.93 kg/m         Assessment & Plan:  Vincent [name] comes in today with chief complaint of Medical Management of Chronic Issues   Diagnosis and orders addressed:  1. Essential hypertension Low sodium diet - CBC with Differential/Platelet - CMP14+EGFR - Lipid panel - lisinopril (ZESTRIL) 10 MG tablet; Take 1 tablet (10 mg total) by mouth daily.  Dispense: 90 tablet; Refill: 1  2. Anxiety with depression Stress management  3. Diverticulosis large intestine w/o perforation or abscess w/o bleeding Watch diet to prevent flare up  4. Primary insomnia Bedtime routine - trazodone (DESYREL) 300 MG tablet; TAKE 1 TABLET BY MOUTH EVERYDAY AT BEDTIME  Dispense: 90 tablet; Refill: 1  5. Hypogonadism in male - testosterone cypionate (DEPOTESTOSTERONE CYPIONATE) 200 MG/ML injection; Inject 1.5 mLs (300 mg total) into the muscle every 14 (fourteen) days.  Dispense: 3 mL; Refill: 0   Labs pending Health Maintenance reviewed Diet and exercise encouraged  Follow up plan: 6 months   [place], FNP

## 2022-11-09 NOTE — Patient Instructions (Signed)

## 2022-11-10 LAB — CBC WITH DIFFERENTIAL/PLATELET
Basophils Absolute: 0 10*3/uL (ref 0.0–0.2)
Basos: 1 %
EOS (ABSOLUTE): 0.2 10*3/uL (ref 0.0–0.4)
Eos: 3 %
Hematocrit: 49.7 % (ref 37.5–51.0)
Hemoglobin: 16.5 g/dL (ref 13.0–17.7)
Immature Grans (Abs): 0 10*3/uL (ref 0.0–0.1)
Immature Granulocytes: 0 %
Lymphocytes Absolute: 2.4 10*3/uL (ref 0.7–3.1)
Lymphs: 40 %
MCH: 29.8 pg (ref 26.6–33.0)
MCHC: 33.2 g/dL (ref 31.5–35.7)
MCV: 90 fL (ref 79–97)
Monocytes Absolute: 0.6 10*3/uL (ref 0.1–0.9)
Monocytes: 11 %
Neutrophils Absolute: 2.8 10*3/uL (ref 1.4–7.0)
Neutrophils: 45 %
Platelets: 287 10*3/uL (ref 150–450)
RBC: 5.53 x10E6/uL (ref 4.14–5.80)
RDW: 13.7 % (ref 11.6–15.4)
WBC: 6 10*3/uL (ref 3.4–10.8)

## 2022-11-10 LAB — CMP14+EGFR
ALT: 56 IU/L — ABNORMAL HIGH (ref 0–44)
AST: 52 IU/L — ABNORMAL HIGH (ref 0–40)
Albumin/Globulin Ratio: 2 (ref 1.2–2.2)
Albumin: 4.4 g/dL (ref 4.1–5.1)
Alkaline Phosphatase: 51 IU/L (ref 44–121)
BUN/Creatinine Ratio: 15 (ref 9–20)
BUN: 18 mg/dL (ref 6–20)
Bilirubin Total: 0.2 mg/dL (ref 0.0–1.2)
CO2: 19 mmol/L — ABNORMAL LOW (ref 20–29)
Calcium: 9.2 mg/dL (ref 8.7–10.2)
Chloride: 102 mmol/L (ref 96–106)
Creatinine, Ser: 1.24 mg/dL (ref 0.76–1.27)
Globulin, Total: 2.2 g/dL (ref 1.5–4.5)
Glucose: 98 mg/dL (ref 70–99)
Potassium: 4.3 mmol/L (ref 3.5–5.2)
Sodium: 138 mmol/L (ref 134–144)
Total Protein: 6.6 g/dL (ref 6.0–8.5)
eGFR: 77 mL/min/{1.73_m2} (ref >59)

## 2022-11-10 LAB — LIPID PANEL
Chol/HDL Ratio: 5.6 ratio — ABNORMAL HIGH (ref 0.0–5.0)
Cholesterol, Total: 228 mg/dL — ABNORMAL HIGH (ref 100–199)
HDL: 41 mg/dL (ref >39)
LDL Chol Calc (NIH): 109 mg/dL — ABNORMAL HIGH (ref 0–99)
Triglycerides: 454 mg/dL — ABNORMAL HIGH (ref 0–149)
VLDL Cholesterol Cal: 78 mg/dL — ABNORMAL HIGH (ref 5–40)

## 2022-11-16 ENCOUNTER — Encounter: Payer: Self-pay | Admitting: Family Medicine

## 2022-11-16 ENCOUNTER — Telehealth (INDEPENDENT_AMBULATORY_CARE_PROVIDER_SITE_OTHER): Payer: 59 | Admitting: Family Medicine

## 2022-11-16 DIAGNOSIS — M62838 Other muscle spasm: Secondary | ICD-10-CM | POA: Diagnosis not present

## 2022-11-16 DIAGNOSIS — M25512 Pain in left shoulder: Secondary | ICD-10-CM

## 2022-11-16 MED ORDER — CYCLOBENZAPRINE HCL 10 MG PO TABS: 10.0000 mg | ORAL_TABLET | Freq: Three times a day (TID) | ORAL | 0 refills | Status: DC | PRN

## 2022-11-16 NOTE — Progress Notes (Signed)
Virtual Visit via telephone Note Due to COVID-19 pandemic this visit was conducted virtually. This visit type was conducted due to national recommendations for restrictions regarding the COVID-19 Pandemic (e.g. social distancing, sheltering in place) in an effort to limit this patient's exposure and mitigate transmission in our community. All issues noted in this document were discussed and addressed.  A physical exam was not performed with this format.   I connected with Vincent [name] on 11/16/2022 at 1200 by telephone and verified that I am speaking with the correct person using two identifiers. Vincent [name] is currently located at home and patient is currently with them during visit. The provider, Monia Pouch, FNP is located in their office at time of visit.  I discussed the limitations, risks, security and privacy concerns of performing an evaluation and management service by virtual visit and the availability of in person appointments. I also discussed with the patient that there may be a patient responsible charge related to this service. The patient expressed understanding and agreed to proceed.  Subjective:  Patient ID: Vincent [name], male    DOB: Oct 16, 1985, 37 y.o.   MRN: [id number]  Chief Complaint:  Shoulder Pain   HPI: Vincent [name] is a 37 y.o. male presenting on 11/16/2022 for Shoulder Pain   Pt states he has been lifting weights over the last several weeks. States he pulled something and the pain has continued. He has been taking mobic and motrin without relief of symptoms.   Shoulder Pain  The pain is present in the left shoulder and back. This is a new problem. The current episode started 1 to 4 weeks ago. There has been no history of extremity trauma. The problem occurs constantly. The problem has been waxing and waning. The quality of the pain is described as aching and burning (cramping). The pain is moderate. Pertinent negatives include no fever. The symptoms  are aggravated by activity. He has tried NSAIDS for the symptoms. The treatment provided no relief.     Relevant past medical, surgical, family, and social history reviewed and updated as indicated.  Allergies and medications reviewed and updated.   Past Medical History:  Diagnosis Date   ADHD (attention deficit hyperactivity disorder)    Anxiety    panic    Asthma     Past Surgical History:  Procedure Laterality Date   MANDIBLE SURGERY  2009   VASECTOMY  2007    Social History   Socioeconomic History   Marital status: Married    Spouse name: Not on file   Number of children: Not on file   Years of education: Not on file   Highest education level: Not on file  Occupational History   Not on file  Tobacco Use   Smoking status: Former    Packs/day: 0.50    Types: Cigarettes   Smokeless tobacco: Never  Vaping Use   Vaping Use: Never used  Substance and Sexual Activity   Alcohol use: No   Drug use: No   Sexual activity: Yes    Birth control/protection: Surgical  Other Topics Concern   Not on file  Social History Narrative   Patient is a Dealer   He is married   Social Determinants of Radio broadcast assistant Strain: Not on file  Food Insecurity: Not on file  Transportation Needs: Not on file  Physical Activity: Not on file  Stress: Not on file  Social Connections: Not on file  Intimate Partner  Violence: Not on file    Outpatient Encounter Medications as of 11/16/2022  Medication Sig   cyclobenzaprine (FLEXERIL) 10 MG tablet Take 1 tablet (10 mg total) by mouth 3 (three) times daily as needed for muscle spasms.   albuterol (VENTOLIN HFA) 108 (90 Base) MCG/ACT inhaler INHALE 2 PUFFS INTO THE LUNGS EVERY 6 HOURS AS NEEDED FOR WHEEZE OR SHORTNESS OF BREATH   lisinopril (ZESTRIL) 10 MG tablet Take 1 tablet (10 mg total) by mouth daily.   meloxicam (MOBIC) 15 MG tablet TAKE 1 TABLET (15 MG TOTAL) BY MOUTH DAILY.   testosterone cypionate (DEPOTESTOSTERONE  CYPIONATE) 200 MG/ML injection Inject 1.5 mLs (300 mg total) into the muscle every 14 (fourteen) days.   trazodone (DESYREL) 300 MG tablet TAKE 1 TABLET BY MOUTH EVERYDAY AT BEDTIME   No facility-administered encounter medications on file as of 11/16/2022.    Allergies  Allergen Reactions   Tigan [Trimethobenzamide Hcl]     Review of Systems  Constitutional:  Negative for activity change, appetite change, chills, fatigue and fever.  HENT: Negative.    Eyes: Negative.   Respiratory:  Negative for cough, chest tightness and shortness of breath.   Cardiovascular:  Negative for chest pain, palpitations and leg swelling.  Gastrointestinal:  Negative for abdominal pain, blood in stool, constipation, diarrhea, nausea and vomiting.  Endocrine: Negative.   Genitourinary:  Negative for dysuria, frequency and urgency.  Musculoskeletal:  Positive for arthralgias and myalgias. Negative for back pain, gait problem and joint swelling.  Skin: Negative.   Allergic/Immunologic: Negative.   Neurological:  Negative for dizziness, weakness and headaches.  Hematological: Negative.   Psychiatric/Behavioral:  Negative for confusion, hallucinations, sleep disturbance and suicidal ideas.   All other systems reviewed and are negative.        Observations/Objective: No vital signs or physical exam, this was a virtual health encounter.  Pt alert and oriented, answers all questions appropriately, and able to speak in full sentences.    Assessment and Plan: Staley was seen today for shoulder pain.  Diagnoses and all orders for this visit:  Muscle spasm Acute pain of left shoulder Continue motrin, stop mobic as these are in the same class. Flexeril as needed for muscle spasms. Symptomatic care discussed in detail. Aware if not improving, he needs to be seen in office.  -     cyclobenzaprine (FLEXERIL) 10 MG tablet; Take 1 tablet (10 mg total) by mouth 3 (three) times daily as needed for muscle  spasms.    Follow Up Instructions: Return if symptoms worsen or fail to improve.    I discussed the assessment and treatment plan with the patient. The patient was provided an opportunity to ask questions and all were answered. The patient agreed with the plan and demonstrated an understanding of the instructions.   The patient was advised to call back or seek an in-person evaluation if the symptoms worsen or if the condition fails to improve as anticipated.  The above assessment and management plan was discussed with the patient. The patient verbalized understanding of and has agreed to the management plan. Patient is aware to call the clinic if they develop any new symptoms or if symptoms persist or worsen. Patient is aware when to return to the clinic for a follow-up visit. Patient educated on when it is appropriate to go to the emergency department.    I provided 12 minutes of time during this telephone encounter.   Monia Pouch, FNP-C [place]  [address] [place], [place] [address] [phone] 11/16/2022

## 2022-12-10 ENCOUNTER — Telehealth: Payer: Self-pay | Admitting: Nurse Practitioner

## 2022-12-10 NOTE — Telephone Encounter (Signed)
Will you review labs and see if medication can be started?

## 2022-12-11 ENCOUNTER — Telehealth: Payer: Self-pay | Admitting: Family Medicine

## 2022-12-11 MED ORDER — ATORVASTATIN CALCIUM 40 MG PO TABS: 40.0000 mg | ORAL_TABLET | Freq: Every day | ORAL | 1 refills | Status: DC

## 2022-12-11 NOTE — Telephone Encounter (Signed)
Left detailed message on patients voicemail that rx was sent to pharmacy and to contact the office with any further needs

## 2022-12-11 NOTE — Telephone Encounter (Signed)
Outcome Approved today CaseId:[id number];Status:Approved;Review Type:Prior Auth;Coverage Start Date:11/11/2022;Coverage End Date:12/11/2023; Authorization Expiration Date: 3/18/202

## 2022-12-11 NOTE — Telephone Encounter (Signed)
Ldl 109 at appointment  Will start on lipitor 40mg  1 po daily  Meds ordered this encounter  Medications   atorvastatin (LIPITOR) 40 MG tablet    Sig: Take 1 tablet (40 mg total) by mouth daily.    Dispense:  90 tablet    Refill:  1    Order Specific Question:   Supervising Provider    Answer:   Worthy Rancher [id number]   [place], FNP

## 2022-12-29 ENCOUNTER — Other Ambulatory Visit: Payer: Self-pay | Admitting: Nurse Practitioner

## 2022-12-31 ENCOUNTER — Other Ambulatory Visit: Payer: Self-pay | Admitting: Nurse Practitioner

## 2022-12-31 DIAGNOSIS — E291 Testicular hypofunction: Secondary | ICD-10-CM

## 2023-02-21 ENCOUNTER — Other Ambulatory Visit: Payer: Self-pay | Admitting: Nurse Practitioner

## 2023-02-21 DIAGNOSIS — F5101 Primary insomnia: Secondary | ICD-10-CM

## 2023-03-26 ENCOUNTER — Other Ambulatory Visit: Payer: Self-pay | Admitting: Nurse Practitioner

## 2023-04-07 ENCOUNTER — Other Ambulatory Visit: Payer: Self-pay | Admitting: Nurse Practitioner

## 2023-04-07 DIAGNOSIS — E291 Testicular hypofunction: Secondary | ICD-10-CM

## 2023-05-20 ENCOUNTER — Other Ambulatory Visit: Payer: Self-pay | Admitting: Nurse Practitioner

## 2023-05-20 DIAGNOSIS — I10 Essential (primary) hypertension: Secondary | ICD-10-CM

## 2023-07-13 ENCOUNTER — Other Ambulatory Visit: Payer: Self-pay | Admitting: Nurse Practitioner

## 2023-08-11 ENCOUNTER — Other Ambulatory Visit: Payer: Self-pay | Admitting: Nurse Practitioner

## 2023-08-11 DIAGNOSIS — F5101 Primary insomnia: Secondary | ICD-10-CM

## 2023-08-12 ENCOUNTER — Other Ambulatory Visit: Payer: Self-pay | Admitting: Nurse Practitioner

## 2023-08-12 DIAGNOSIS — E291 Testicular hypofunction: Secondary | ICD-10-CM

## 2023-09-06 ENCOUNTER — Encounter: Payer: Self-pay | Admitting: Nurse Practitioner

## 2023-09-06 ENCOUNTER — Other Ambulatory Visit: Payer: Self-pay | Admitting: Nurse Practitioner

## 2023-09-06 DIAGNOSIS — I10 Essential (primary) hypertension: Secondary | ICD-10-CM

## 2023-09-06 NOTE — Telephone Encounter (Signed)
MMM pt NTBS 30-d given 08/13/23

## 2023-09-06 NOTE — Telephone Encounter (Signed)
Letter mailed

## 2023-09-09 ENCOUNTER — Other Ambulatory Visit: Payer: Self-pay | Admitting: Nurse Practitioner

## 2023-09-09 DIAGNOSIS — E291 Testicular hypofunction: Secondary | ICD-10-CM

## 2023-09-11 ENCOUNTER — Other Ambulatory Visit: Payer: Self-pay | Admitting: Nurse Practitioner

## 2023-09-11 DIAGNOSIS — I10 Essential (primary) hypertension: Secondary | ICD-10-CM

## 2023-09-11 NOTE — Telephone Encounter (Signed)
LEFT MESSAGE TO SCHEDULE APPT TO MEDS REFILL

## 2023-09-11 NOTE — Telephone Encounter (Signed)
MMM pt NTBS 30-d given 08/13/23

## 2023-09-26 ENCOUNTER — Telehealth: Payer: Self-pay | Admitting: Family Medicine

## 2023-09-26 ENCOUNTER — Other Ambulatory Visit: Payer: Self-pay | Admitting: Nurse Practitioner

## 2023-09-26 DIAGNOSIS — I10 Essential (primary) hypertension: Secondary | ICD-10-CM

## 2023-09-26 NOTE — Telephone Encounter (Signed)
 Copied from CRM [id number]. Topic: Clinical - Medication Question >> Sep 26, 2023 11:00 AM Chase C wrote: Reason for CRM: Patient called back regarding CRM [id number]. Patient states that he received a letter for a refill for lisinopril  (ZESTRIL ) 10 MG tablet. Per CAL, courtesy refill was done in Nov 2024, and patient needs to make an appt. Advised patient is requesting the refill now. Patient was transferred to CAL.

## 2023-09-26 NOTE — Telephone Encounter (Signed)
 Pt made appt to see PCP on 10/14/23 for med refill. Due to work schedule, this is the soonest he could schedule for.   Pt requesting a refill for LISINOPRIL  Rx be sent to [facility] in [place], to last him until this appt. Says he is completely out of his medicine and BP is running high.

## 2023-10-01 ENCOUNTER — Other Ambulatory Visit: Payer: Self-pay | Admitting: Nurse Practitioner

## 2023-10-02 ENCOUNTER — Ambulatory Visit: Payer: Self-pay | Admitting: Nurse Practitioner

## 2023-10-02 NOTE — Telephone Encounter (Signed)
 Copied from CRM [id number]. Topic: Clinical - Red Word Triage >> Oct 02, 2023  5:36 PM Vincent [name] wrote: Red Word that prompted transfer to Nurse Triage: Patient called that he has an appointment on 10/14/2023 and he has a very bad migrane and he just took his blood pressure and it read 166/112 and he has taken medication earlier and still feels horrible. Transferred to Nurse Triage Line.   Chief Complaint: High blood pressure  Symptoms: High blood pressure, headache  Frequency: Constant  Pertinent Negatives: Patient denies blurred vision, unsteady gait Disposition: [x] ED /[] Urgent Care (no appt availability in office) / [] Appointment(In office/virtual)/ []  [facility]/ [] Home Care/ [] Refused Recommended Disposition /[] [facility]/ []  Follow-up with PCP Additional Notes: Patient reports his blood pressure has been elevated all day today, stating his most recent reading was 166/112 which he took right before calling. Patient states that with his blood pressure he has been experiencing a headache. Patient states that he takes 10 mg of Lisinopril  daily for his blood pressure and that he took 2 additional doses today for 30 mg total without any improvement of his symptoms. Patient advised to go to the ED due to his symptoms and no improvement with medication at home. Patient states he is unable to go to the ED and would like to see his PCP to discuss changing his medications.    Reason for Disposition  [1] Systolic BP  >= 160 OR Diastolic >= 100 AND [2] cardiac (e.g., breathing difficulty, chest pain) or neurologic symptoms (e.g., new-onset blurred or double vision, unsteady gait)  Answer Assessment - Initial Assessment Questions 1. BLOOD PRESSURE: What is the blood pressure? Did you take at least two measurements 5 minutes apart?     166/112 2. ONSET: When did you take your blood pressure?     7 minutes prior to call 3. HOW: How did you take your blood pressure? (e.g.,  automatic home BP monitor, visiting nurse)     Automatic BP cuff at home  4. HISTORY: Do you have a history of high blood pressure?     Yes 5. MEDICINES: Are you taking any medicines for blood pressure? Have you missed any doses recently?     Takes 10 mg of Lisinopril . Took an extra 20 mg for at total of 30 mg today.  6. OTHER SYMPTOMS: Do you have any symptoms? (e.g., blurred vision, chest pain, difficulty breathing, headache, weakness)     Headache  Protocols used: Blood Pressure - High-A-AH

## 2023-10-03 ENCOUNTER — Encounter: Payer: Self-pay | Admitting: Family Medicine

## 2023-10-03 ENCOUNTER — Ambulatory Visit (INDEPENDENT_AMBULATORY_CARE_PROVIDER_SITE_OTHER): Payer: 59 | Admitting: Family Medicine

## 2023-10-03 VITALS — BP 147/73 | HR 77 | Temp 98.2°F | Ht 69.0 in | Wt 241.2 lb

## 2023-10-03 DIAGNOSIS — I10 Essential (primary) hypertension: Secondary | ICD-10-CM

## 2023-10-03 DIAGNOSIS — F418 Other specified anxiety disorders: Secondary | ICD-10-CM

## 2023-10-03 MED ORDER — LISINOPRIL 40 MG PO TABS: 40.0000 mg | ORAL_TABLET | Freq: Every day | ORAL | 0 refills | Status: DC

## 2023-10-03 NOTE — Patient Instructions (Signed)

## 2023-10-03 NOTE — Progress Notes (Signed)
 Acute Office Visit  Subjective:     Patient ID: Vincent [name], male    DOB: 11/21/1985, 38 y.o.   MRN: [id number]  Chief Complaint  Patient presents with   Hypertension    HPI Patient is in today for elevated BP. BP has been 140s-160s/90-100s at home. He has been taking lisinopril  20 since 09/24/22. He took 30 mg of lisinopril  yesterday without improvement. He has had an intermittent headache for the last few weeks that increased in severity yesterday. Denies chest pain, shortness of breath, changes in vision, dizziness, focal weakness, edema. He has been working on improving his diet and has recently lost 7 lbs.      10/03/2023    9:41 AM 11/09/2022    3:53 PM 08/21/2022   11:21 AM  Depression screen PHQ 2/9  Decreased Interest 1 0 0  Down, Depressed, Hopeless 0 0 0  PHQ - 2 Score 1 0 0  Altered sleeping 1 0 0  Tired, decreased energy 3 0 0  Change in appetite 2 0 0  Feeling bad or failure about yourself  0 0 0  Trouble concentrating 2 0 1  Moving slowly or fidgety/restless 0 0 0  Suicidal thoughts 0 0 0  PHQ-9 Score 9 0 1  Difficult doing work/chores Somewhat difficult Not difficult at all Not difficult at all      10/03/2023    9:42 AM 11/09/2022    3:53 PM 08/21/2022   11:21 AM 02/06/2022    9:47 AM  GAD 7 : Generalized Anxiety Score  Nervous, Anxious, on Edge 0 0 0 0  Control/stop worrying 0 1 1 0  Worry too much - different things 0 1 1 0  Trouble relaxing 2 1 0 0  Restless 1 1 0 0  Easily annoyed or irritable 0 0 0 0  Afraid - awful might happen 0 0 0 0  Total GAD 7 Score 3 4 2  0  Anxiety Difficulty Somewhat difficult Not difficult at all Not difficult at all Not difficult at all      ROS As per HPI.      Objective:    BP (!) 147/73   Pulse 77   Temp 98.2 F (36.8 C) (Temporal)   Ht 5' 9 (1.753 m)   Wt 241 lb 3.2 oz (109.4 kg)   SpO2 94%   BMI 35.62 kg/m    Physical Exam Vitals and nursing note reviewed.  Constitutional:      General: He  is not in acute distress.    Appearance: He is obese. He is not ill-appearing, toxic-appearing or diaphoretic.  HENT:     Head: Normocephalic and atraumatic.  Eyes:     General:        Right eye: No discharge.        Left eye: No discharge.     Extraocular Movements: Extraocular movements intact.     Pupils: Pupils are equal, round, and reactive to light.  Cardiovascular:     Rate and Rhythm: Normal rate and regular rhythm.     Heart sounds: Normal heart sounds. No murmur heard. Pulmonary:     Effort: Pulmonary effort is normal. No respiratory distress.     Breath sounds: Normal breath sounds. No wheezing.  Musculoskeletal:     Cervical back: Neck supple. No rigidity.     Right lower leg: No edema.     Left lower leg: No edema.  Skin:    General: Skin is warm  and dry.  Neurological:     General: No focal deficit present.     Mental Status: He is alert and oriented to person, place, and time.     Cranial Nerves: No cranial nerve deficit.     Motor: No weakness.     Coordination: Coordination normal.     Gait: Gait normal.  Psychiatric:        Mood and Affect: Mood normal.        Behavior: Behavior normal.     No results found for any visits on 10/03/23.      Assessment & Plan:   Vincent [name] was seen today for hypertension.  Diagnoses and all orders for this visit:  Primary hypertension BP not at goal. Increase to lisinopril  40 mg. Continue to monitor BP at home. Discussed lifestyle management. Follow up with PCP for recheck and management of chronic illnesses.  -     lisinopril  (ZESTRIL ) 40 MG tablet; Take 1 tablet (40 mg total) by mouth daily.  Anxiety with depression Difficulty with sleep due to work schedule. Stable, denies SI. Follow up with PCP.    Return in about 2 weeks (around 10/17/2023) for chronic follow up with PCP- BP check, testosterone .  The patient indicates understanding of these issues and agrees with the plan.  Vincent CHRISTELLA Search, FNP

## 2023-10-14 ENCOUNTER — Ambulatory Visit (INDEPENDENT_AMBULATORY_CARE_PROVIDER_SITE_OTHER): Payer: 59 | Admitting: Nurse Practitioner

## 2023-10-14 ENCOUNTER — Encounter: Payer: Self-pay | Admitting: Nurse Practitioner

## 2023-10-14 ENCOUNTER — Ambulatory Visit: Payer: 59 | Admitting: Nurse Practitioner

## 2023-10-14 VITALS — BP 113/67 | HR 88 | Temp 98.1°F | Ht 69.0 in | Wt 234.0 lb

## 2023-10-14 DIAGNOSIS — K5730 Diverticulosis of large intestine without perforation or abscess without bleeding: Secondary | ICD-10-CM | POA: Diagnosis not present

## 2023-10-14 DIAGNOSIS — F5101 Primary insomnia: Secondary | ICD-10-CM | POA: Diagnosis not present

## 2023-10-14 DIAGNOSIS — Z6834 Body mass index (BMI) 34.0-34.9, adult: Secondary | ICD-10-CM

## 2023-10-14 DIAGNOSIS — E291 Testicular hypofunction: Secondary | ICD-10-CM

## 2023-10-14 DIAGNOSIS — I10 Essential (primary) hypertension: Secondary | ICD-10-CM

## 2023-10-14 DIAGNOSIS — F418 Other specified anxiety disorders: Secondary | ICD-10-CM | POA: Diagnosis not present

## 2023-10-14 MED ORDER — TESTOSTERONE CYPIONATE 200 MG/ML IM SOLN: 300.0000 mg | INTRAMUSCULAR | 1 refills | Status: DC

## 2023-10-14 MED ORDER — LISINOPRIL 40 MG PO TABS: 40.0000 mg | ORAL_TABLET | Freq: Every day | ORAL | 1 refills | Status: DC

## 2023-10-14 MED ORDER — TRAZODONE HCL 300 MG PO TABS: ORAL_TABLET | ORAL | 2 refills | Status: DC

## 2023-10-14 MED ORDER — ATORVASTATIN CALCIUM 40 MG PO TABS: 40.0000 mg | ORAL_TABLET | Freq: Every day | ORAL | 1 refills | Status: DC

## 2023-10-14 NOTE — Progress Notes (Signed)
Subjective:    Patient ID: Vincent [name], male    DOB: August 17, 1986, 38 y.o.   MRN: [id number]   Chief Complaint: No chief complaint on file.    HPI:  Vincent [name] is a 38 y.o. who identifies as a male who was assigned male at birth.   Social history: Lives with: wife and daughter Work history: [employer]   Comes in today for follow up of the following chronic medical issues:  1. Essential hypertension Was having headaches and was seen about 2 weeks ago. Blood pressure med increased to lisinopril 40mg  daily. Blood pressures at home since then have been below 140 systolic. BP Readings from Last 3 Encounters:  10/03/23 (!) 147/73  11/09/22 116/77  08/21/22 (!) 145/88     2. Anxiety with depression Has history of anxiety and depression. He is much better and is on no medication.    10/03/2023    9:42 AM 11/09/2022    3:53 PM 08/21/2022   11:21 AM 02/06/2022    9:47 AM  GAD 7 : Generalized Anxiety Score  Nervous, Anxious, on Edge 0 0 0 0  Control/stop worrying 0 1 1 0  Worry too much - different things 0 1 1 0  Trouble relaxing 2 1 0 0  Restless 1 1 0 0  Easily annoyed or irritable 0 0 0 0  Afraid - awful might happen 0 0 0 0  Total GAD 7 Score 3 4 2  0  Anxiety Difficulty Somewhat difficult Not difficult at all Not difficult at all Not difficult at all       10/03/2023    9:41 AM 11/09/2022    3:53 PM 08/21/2022   11:21 AM  Depression screen PHQ 2/9  Decreased Interest 1 0 0  Down, Depressed, Hopeless 0 0 0  PHQ - 2 Score 1 0 0  Altered sleeping 1 0 0  Tired, decreased energy 3 0 0  Change in appetite 2 0 0  Feeling bad or failure about yourself  0 0 0  Trouble concentrating 2 0 1  Moving slowly or fidgety/restless 0 0 0  Suicidal thoughts 0 0 0  PHQ-9 Score 9 0 1  Difficult doing work/chores Somewhat difficult Not difficult at all Not difficult at all     3. Diverticulosis large intestine w/o perforation or abscess w/o bleeding Had flare up in  November. No problems after completing treatment.  4. Primary insomnia Takes trazadone to sleep and sleep about 7-8 hours a night.  5. Hypogonadism in male Is on testosterone and is doing well. Lab Results  Component Value Date   TESTOSTERONE 433 05/04/2022   6. BMI 34.0-3.9 Weight is down 7lbs Wt Readings from Last 3 Encounters:  10/14/23 234 lb (106.1 kg)  10/03/23 241 lb 3.2 oz (109.4 kg)  11/09/22 223 lb (101.2 kg)   BMI Readings from Last 3 Encounters:  10/14/23 34.56 kg/m  10/03/23 35.62 kg/m  11/09/22 32.93 kg/m      New complaints: None today  Allergies  Allergen Reactions   Tigan [Trimethobenzamide Hcl]    Outpatient Encounter Medications as of 10/14/2023  Medication Sig   albuterol (VENTOLIN HFA) 108 (90 Base) MCG/ACT inhaler INHALE 2 PUFFS INTO THE LUNGS EVERY 6 HOURS AS NEEDED FOR WHEEZE OR SHORTNESS OF BREATH   atorvastatin (LIPITOR) 40 MG tablet Take 1 tablet (40 mg total) by mouth daily.   lisinopril (ZESTRIL) 40 MG tablet Take 1 tablet (40 mg total) by mouth daily.  meloxicam (MOBIC) 15 MG tablet TAKE 1 TABLET (15 MG TOTAL) BY MOUTH DAILY.   testosterone cypionate (DEPOTESTOSTERONE CYPIONATE) 200 MG/ML injection INJECT 1.5 MLS (300 MG TOTAL) INTO THE MUSCLE EVERY 14 (FOURTEEN) DAYS.   trazodone (DESYREL) 300 MG tablet TAKE 1 TABLET BY MOUTH EVERYDAY AT BEDTIME   No facility-administered encounter medications on file as of 10/14/2023.    Past Surgical History:  Procedure Laterality Date   MANDIBLE SURGERY  2009   VASECTOMY  2007    Family History  Family history unknown: Yes      Controlled substance contract: n/a     Review of Systems  Constitutional:  Negative for diaphoresis.  Eyes:  Negative for pain.  Respiratory:  Negative for shortness of breath.   Cardiovascular:  Negative for chest pain, palpitations and leg swelling.  Gastrointestinal:  Negative for abdominal pain.  Endocrine: Negative for polydipsia.  Skin:  Negative for  rash.  Neurological:  Negative for dizziness, weakness and headaches.  Hematological:  Does not bruise/bleed easily.  All other systems reviewed and are negative.      Objective:   Physical Exam Vitals and nursing note reviewed.  Constitutional:      Appearance: Normal appearance. He is well-developed.  HENT:     Head: Normocephalic.     Nose: Nose normal.     Mouth/Throat:     Mouth: Mucous membranes are moist.     Pharynx: Oropharynx is clear.  Eyes:     Pupils: Pupils are equal, round, and reactive to light.  Neck:     Thyroid: No thyroid mass or thyromegaly.     Vascular: No carotid bruit or JVD.     Trachea: Phonation normal.  Cardiovascular:     Rate and Rhythm: Normal rate and regular rhythm.  Pulmonary:     Effort: Pulmonary effort is normal. No respiratory distress.     Breath sounds: Normal breath sounds.  Abdominal:     General: Bowel sounds are normal.     Palpations: Abdomen is soft.     Tenderness: There is no abdominal tenderness.  Musculoskeletal:        General: Normal range of motion.     Cervical back: Normal range of motion and neck supple.  Lymphadenopathy:     Cervical: No cervical adenopathy.  Skin:    General: Skin is warm and dry.  Neurological:     Mental Status: He is alert and oriented to person, place, and time.  Psychiatric:        Behavior: Behavior normal.        Thought Content: Thought content normal.        Judgment: Judgment normal.    There were no vitals taken for this visit.        Assessment & Plan:  Vincent [name] comes in today with chief complaint of No chief complaint on file.   Diagnosis and orders addressed:  1. Essential hypertension Low sodium diet - CBC with Differential/Platelet - CMP14+EGFR - Lipid panel - lisinopril (ZESTRIL) 10 MG tablet; Take 1 tablet (10 mg total) by mouth daily.  Dispense: 90 tablet; Refill: 1  2. Anxiety with depression Stress management  3. Diverticulosis large intestine  w/o perforation or abscess w/o bleeding Watch diet to prevent flare up  4. Primary insomnia Bedtime routine - trazodone (DESYREL) 300 MG tablet; TAKE 1 TABLET BY MOUTH EVERYDAY AT BEDTIME  Dispense: 90 tablet; Refill: 1  5. Hypogonadism in male - testosterone cypionate (DEPOTESTOSTERONE CYPIONATE)  200 MG/ML injection; Inject 1.5 mLs (300 mg total) into the muscle every 14 (fourteen) days.  Dispense: 3 mL; Refill: 0   Labs pending Health Maintenance reviewed Diet and exercise encouraged  Follow up plan: 6 months   Mary-Margaret Daphine Deutscher, FNP

## 2023-10-14 NOTE — Patient Instructions (Signed)

## 2023-10-15 ENCOUNTER — Other Ambulatory Visit: Payer: 59

## 2023-10-15 DIAGNOSIS — I10 Essential (primary) hypertension: Secondary | ICD-10-CM

## 2023-10-15 DIAGNOSIS — E291 Testicular hypofunction: Secondary | ICD-10-CM

## 2023-10-16 LAB — CBC WITH DIFFERENTIAL/PLATELET
Basophils Absolute: 0 10*3/uL (ref 0.0–0.2)
Basos: 0 %
EOS (ABSOLUTE): 0.1 10*3/uL (ref 0.0–0.4)
Eos: 2 %
Hematocrit: 52 % — ABNORMAL HIGH (ref 37.5–51.0)
Hemoglobin: 17.3 g/dL (ref 13.0–17.7)
Immature Grans (Abs): 0 10*3/uL (ref 0.0–0.1)
Immature Granulocytes: 0 %
Lymphocytes Absolute: 2.2 10*3/uL (ref 0.7–3.1)
Lymphs: 45 %
MCH: 30 pg (ref 26.6–33.0)
MCHC: 33.3 g/dL (ref 31.5–35.7)
MCV: 90 fL (ref 79–97)
Monocytes Absolute: 0.6 10*3/uL (ref 0.1–0.9)
Monocytes: 12 %
Neutrophils Absolute: 2 10*3/uL (ref 1.4–7.0)
Neutrophils: 41 %
Platelets: 245 10*3/uL (ref 150–450)
RBC: 5.77 x10E6/uL (ref 4.14–5.80)
RDW: 12.6 % (ref 11.6–15.4)
WBC: 4.9 10*3/uL (ref 3.4–10.8)

## 2023-10-16 LAB — CMP14+EGFR
ALT: 49 [IU]/L — ABNORMAL HIGH (ref 0–44)
AST: 40 [IU]/L (ref 0–40)
Albumin: 4.6 g/dL (ref 4.1–5.1)
Alkaline Phosphatase: 51 [IU]/L (ref 44–121)
BUN/Creatinine Ratio: 15 (ref 9–20)
BUN: 19 mg/dL (ref 6–20)
Bilirubin Total: 0.9 mg/dL (ref 0.0–1.2)
CO2: 24 mmol/L (ref 20–29)
Calcium: 9.8 mg/dL (ref 8.7–10.2)
Chloride: 100 mmol/L (ref 96–106)
Creatinine, Ser: 1.26 mg/dL (ref 0.76–1.27)
Globulin, Total: 2.2 g/dL (ref 1.5–4.5)
Glucose: 106 mg/dL — ABNORMAL HIGH (ref 70–99)
Potassium: 4.8 mmol/L (ref 3.5–5.2)
Sodium: 138 mmol/L (ref 134–144)
Total Protein: 6.8 g/dL (ref 6.0–8.5)
eGFR: 75 mL/min/{1.73_m2} (ref >59)

## 2023-10-16 LAB — LIPID PANEL
Chol/HDL Ratio: 2.6 {ratio} (ref 0.0–5.0)
Cholesterol, Total: 105 mg/dL (ref 100–199)
HDL: 40 mg/dL (ref >39)
LDL Chol Calc (NIH): 50 mg/dL (ref 0–99)
Triglycerides: 72 mg/dL (ref 0–149)
VLDL Cholesterol Cal: 15 mg/dL (ref 5–40)

## 2023-10-16 LAB — TESTOSTERONE,FREE AND TOTAL
Testosterone, Free: 29.7 pg/mL — ABNORMAL HIGH (ref 8.7–25.1)
Testosterone: 1094 ng/dL — ABNORMAL HIGH (ref 264–916)

## 2023-10-17 NOTE — Telephone Encounter (Signed)
Copied from CRM [id number]. Topic: Clinical - Lab/Test Results >> Oct 17, 2023  8:10 AM Jorje Guild R wrote: Reason for CRM: Patient wants to give this message to Dr. Daphine Deutscher Nurse which is: Testorstone shot was just taking the day before lab work so that is why it was high. Patient doesn't want to get any meds change due to the levels they were.

## 2023-10-28 ENCOUNTER — Telehealth (INDEPENDENT_AMBULATORY_CARE_PROVIDER_SITE_OTHER): Payer: 59 | Admitting: Family Medicine

## 2023-10-28 DIAGNOSIS — J4520 Mild intermittent asthma, uncomplicated: Secondary | ICD-10-CM

## 2023-10-28 DIAGNOSIS — U071 COVID-19: Secondary | ICD-10-CM | POA: Diagnosis not present

## 2023-10-28 MED ORDER — FLUTICASONE PROPIONATE 50 MCG/ACT NA SUSP: 1.0000 | Freq: Two times a day (BID) | NASAL | 1 refills | Status: DC | PRN

## 2023-10-28 MED ORDER — NIRMATRELVIR/RITONAVIR (PAXLOVID)TABLET: 3.0000 | ORAL_TABLET | Freq: Two times a day (BID) | ORAL | 0 refills | Status: DC

## 2023-10-28 MED ORDER — ALBUTEROL SULFATE HFA 108 (90 BASE) MCG/ACT IN AERS: 1.0000 | INHALATION_SPRAY | Freq: Four times a day (QID) | RESPIRATORY_TRACT | 2 refills | Status: DC | PRN

## 2023-10-28 MED ORDER — MOLNUPIRAVIR EUA 200MG CAPSULE: 4.0000 | ORAL_CAPSULE | Freq: Two times a day (BID) | ORAL | 0 refills | Status: AC

## 2023-10-28 MED ORDER — PREDNISONE 20 MG PO TABS: ORAL_TABLET | ORAL | 0 refills | Status: DC

## 2023-10-28 NOTE — Progress Notes (Signed)
Virtual Visit via MyChart video note  I connected with Vincent [name] on 10/28/23 at 1139 by video and verified that I am speaking with the correct person using two identifiers. Vincent [name] is currently located at home and patient are currently with her during visit. The provider, Elige Radon Tyjae Issa, MD is located in their office at time of visit.  Call ended at 1148  I discussed the limitations, risks, security and privacy concerns of performing an evaluation and management service by video and the availability of in person appointments. I also discussed with the patient that there may be a patient responsible charge related to this service. The patient expressed understanding and agreed to proceed.   History and Present Illness: Patient is having sinus congestion and tested psotive for covid 3 days ago.  And having body aches and fevers and chills. His fever 100.3 and he is taking motrin and tylenol.  He is taking mucinex and vicks and sinus pressure is worsening. His wife is a Engineer, civil (consulting) and may have been in contact with it. He is not covid vaccinated for covid but had it 2 years ago. He is usig albuterol for his wheezing and is using it.   1. COVID   2. Mild intermittent asthma without complication     Outpatient Encounter Medications as of 10/28/2023  Medication Sig   fluticasone (FLONASE) 50 MCG/ACT nasal spray Place 1 spray into both nostrils 2 (two) times daily as needed for allergies or rhinitis.   nirmatrelvir/ritonavir (PAXLOVID) 20 x 150 MG & 10 x 100MG  TABS Take 3 tablets by mouth 2 (two) times daily for 5 days. (Take nirmatrelvir 150 mg two tablets twice daily for 5 days and ritonavir 100 mg one tablet twice daily for 5 days) Patient GFR is 75   predniSONE (DELTASONE) 20 MG tablet 2 po at same time daily for 5 days   albuterol (VENTOLIN HFA) 108 (90 Base) MCG/ACT inhaler Inhale 1-2 puffs into the lungs every 6 (six) hours as needed for wheezing or shortness of breath.    atorvastatin (LIPITOR) 40 MG tablet Take 1 tablet (40 mg total) by mouth daily.   lisinopril (ZESTRIL) 40 MG tablet Take 1 tablet (40 mg total) by mouth daily.   meloxicam (MOBIC) 15 MG tablet TAKE 1 TABLET (15 MG TOTAL) BY MOUTH DAILY.   testosterone cypionate (DEPOTESTOSTERONE CYPIONATE) 200 MG/ML injection Inject 1.5 mLs (300 mg total) into the muscle every 14 (fourteen) days.   trazodone (DESYREL) 300 MG tablet TAKE 1 TABLET BY MOUTH EVERYDAY AT BEDTIME   [DISCONTINUED] albuterol (VENTOLIN HFA) 108 (90 Base) MCG/ACT inhaler INHALE 2 PUFFS INTO THE LUNGS EVERY 6 HOURS AS NEEDED FOR WHEEZE OR SHORTNESS OF BREATH   No facility-administered encounter medications on file as of 10/28/2023.    Review of Systems  Constitutional:  Positive for chills and fever.  HENT:  Positive for congestion, postnasal drip, rhinorrhea and sinus pressure. Negative for ear discharge, ear pain, sneezing, sore throat and voice change.   Eyes:  Negative for pain, discharge, redness and visual disturbance.  Respiratory:  Positive for cough. Negative for shortness of breath and wheezing.   Cardiovascular:  Negative for chest pain and leg swelling.  Musculoskeletal:  Positive for myalgias. Negative for gait problem.  Skin:  Negative for rash.  All other systems reviewed and are negative.   Observations/Objective: Patient sounds comfortable and in no acute distress  Assessment and Plan: Problem List Items Addressed This Visit  Respiratory   Mild intermittent asthma without complication   Relevant Medications   albuterol (VENTOLIN HFA) 108 (90 Base) MCG/ACT inhaler   predniSONE (DELTASONE) 20 MG tablet   fluticasone (FLONASE) 50 MCG/ACT nasal spray   Other Visit Diagnoses       COVID    -  Primary   Relevant Medications   nirmatrelvir/ritonavir (PAXLOVID) 20 x 150 MG & 10 x 100MG  TABS   predniSONE (DELTASONE) 20 MG tablet   fluticasone (FLONASE) 50 MCG/ACT nasal spray       Tested positive for  COVID at home, will treat like COVID with Paxlovid, also gave a short course of prednisone because of his asthma and refilled his butyryl inhaler recommend Flonase. Follow up plan: Return if symptoms worsen or fail to improve.     I discussed the assessment and treatment plan with the patient. The patient was provided an opportunity to ask questions and all were answered. The patient agreed with the plan and demonstrated an understanding of the instructions.   The patient was advised to call back or seek an in-person evaluation if the symptoms worsen or if the condition fails to improve as anticipated.  The above assessment and management plan was discussed with the patient. The patient verbalized understanding of and has agreed to the management plan. Patient is aware to call the clinic if symptoms persist or worsen. Patient is aware when to return to the clinic for a follow-up visit. Patient educated on when it is appropriate to go to the emergency department.    I provided 9 minutes of non-face-to-face time during this encounter.    Nils Pyle, MD

## 2023-10-28 NOTE — Telephone Encounter (Signed)
Copied from CRM [id number]. Topic: Clinical - Prescription Issue >> Oct 28, 2023 12:25 PM Eunice Blase wrote: Reason for CRM: Pt called stated he can't afford nirmatrelvir/ritonavir (PAXLOVID) 20 x 150 MG & 10 x 100MG  TABS is there something else you can give him. Please call pt [phone].

## 2023-10-29 ENCOUNTER — Telehealth: Payer: Self-pay | Admitting: Family Medicine

## 2023-10-29 NOTE — Telephone Encounter (Signed)
 Copied from CRM [id number]. Topic: Clinical - Prescription Issue >> Oct 29, 2023  3:56 PM Powell HERO wrote: Reason for CRM: The patient is calling to state that his mediations molnupiravir  EUA (LAGEVRIO ) 200 mg CAPS capsule, he said it is way to expensive and he wants to know if he wants to get an antibiotic for his sinus infection. Please advise patient and he would like you to leave a message if he does not answer.

## 2023-10-29 NOTE — Telephone Encounter (Signed)
 Please advise

## 2023-11-09 ENCOUNTER — Other Ambulatory Visit: Payer: Self-pay | Admitting: Nurse Practitioner

## 2023-11-19 ENCOUNTER — Other Ambulatory Visit: Payer: Self-pay | Admitting: Family Medicine

## 2023-11-19 DIAGNOSIS — U071 COVID-19: Secondary | ICD-10-CM

## 2023-11-19 DIAGNOSIS — J4520 Mild intermittent asthma, uncomplicated: Secondary | ICD-10-CM

## 2023-12-29 ENCOUNTER — Other Ambulatory Visit: Payer: Self-pay | Admitting: Nurse Practitioner

## 2023-12-29 DIAGNOSIS — E291 Testicular hypofunction: Secondary | ICD-10-CM

## 2024-01-01 ENCOUNTER — Telehealth: Payer: Self-pay

## 2024-01-01 ENCOUNTER — Other Ambulatory Visit (HOSPITAL_COMMUNITY): Payer: Self-pay

## 2024-01-01 NOTE — Telephone Encounter (Signed)
 Pharmacy Patient Advocate Encounter   Received notification from CoverMyMeds that prior authorization for Testosterone Cypionate 200MG /ML intramuscular solution is required/requested.   Insurance verification completed.   The patient is insured through [employer] .   Per test claim: PA required; PA submitted to above mentioned insurance via CoverMyMeds Key/confirmation #/EOC [id number] Status is pending

## 2024-01-01 NOTE — Telephone Encounter (Signed)
 Pharmacy Patient Advocate Encounter  Received notification from [facility] that Prior Authorization for Testosterone Cypionate 200MG /ML intramuscular solution has been APPROVED from 12/02/23 to 12/31/24. Ran test claim, Copay is $48.57. This test claim was processed through [facility]- copay amounts may vary at other pharmacies due to pharmacy/plan contracts, or as the patient moves through the different stages of their insurance plan.   PA #/Case ID/Reference #: [id number]

## 2024-01-09 ENCOUNTER — Other Ambulatory Visit: Payer: Self-pay | Admitting: Family Medicine

## 2024-01-09 ENCOUNTER — Other Ambulatory Visit: Payer: Self-pay | Admitting: Nurse Practitioner

## 2024-01-09 DIAGNOSIS — J4520 Mild intermittent asthma, uncomplicated: Secondary | ICD-10-CM

## 2024-03-06 ENCOUNTER — Other Ambulatory Visit: Payer: Self-pay | Admitting: Nurse Practitioner

## 2024-03-06 DIAGNOSIS — E291 Testicular hypofunction: Secondary | ICD-10-CM

## 2024-03-06 NOTE — Telephone Encounter (Signed)
 Copied from CRM [id number]. Topic: Clinical - Medication Refill >> Mar 06, 2024  9:40 AM Tiffany H wrote: Medication: testosterone  cypionate (DEPOTESTOSTERONE CYPIONATE) 200 MG/ML injection  Patient is over a week late for injection. Please expedite to have refilled today.   Has the patient contacted their pharmacy? Yes (Agent: If no, request that the patient contact the pharmacy for the refill. If patient does not wish to contact the pharmacy document the reason why and proceed with request.) (Agent: If yes, when and what did the pharmacy advise?)  This is the patient's preferred pharmacy:  [id number] - [place], [place] - [address] [place] [address] Phone: [phone] Fax: [phone]  Is this the correct pharmacy for this prescription? Yes If no, delete pharmacy and type the correct one.   Has the prescription been filled recently? No  Is the patient out of the medication? Yes  Has the patient been seen for an appointment in the last year OR does the patient have an upcoming appointment? Yes  Can we respond through MyChart? Yes  Agent: Please be advised that Rx refills may take up to 3 business days. We ask that you follow-up with your pharmacy.

## 2024-03-09 ENCOUNTER — Telehealth (INDEPENDENT_AMBULATORY_CARE_PROVIDER_SITE_OTHER): Admitting: Nurse Practitioner

## 2024-03-09 ENCOUNTER — Encounter: Payer: Self-pay | Admitting: Nurse Practitioner

## 2024-03-09 DIAGNOSIS — B9789 Other viral agents as the cause of diseases classified elsewhere: Secondary | ICD-10-CM

## 2024-03-09 DIAGNOSIS — J0100 Acute maxillary sinusitis, unspecified: Secondary | ICD-10-CM | POA: Diagnosis not present

## 2024-03-09 DIAGNOSIS — R051 Acute cough: Secondary | ICD-10-CM

## 2024-03-09 MED ORDER — PREDNISONE 20 MG PO TABS: 40.0000 mg | ORAL_TABLET | Freq: Every day | ORAL | 0 refills | Status: AC

## 2024-03-09 MED ORDER — AMOXICILLIN-POT CLAVULANATE 875-125 MG PO TABS: 1.0000 | ORAL_TABLET | Freq: Two times a day (BID) | ORAL | 0 refills | Status: DC

## 2024-03-09 MED ORDER — HYDROCODONE BIT-HOMATROP MBR 5-1.5 MG/5ML PO SOLN: 5.0000 mL | Freq: Four times a day (QID) | ORAL | 0 refills | Status: DC | PRN

## 2024-03-09 NOTE — Progress Notes (Signed)
 Virtual Visit Consent   Vincent [name], you are scheduled for a virtual visit with Mary-Margaret Gaylyn Keas, FNP, a [facility] provider, today.     Just as with appointments in the office, your consent must be obtained to participate.  Your consent will be active for this visit and any virtual visit you may have with one of our providers in the next 365 days.     If you have a MyChart account, a copy of this consent can be sent to you electronically.  All virtual visits are billed to your insurance company just like a traditional visit in the office.    As this is a virtual visit, video technology does not allow for your provider to perform a traditional examination.  This may limit your provider's ability to fully assess your condition.  If your provider identifies any concerns that need to be evaluated in person or the need to arrange testing (such as labs, EKG, etc.), we will make arrangements to do so.     Although advances in technology are sophisticated, we cannot ensure that it will always work on either your end or our end.  If the connection with a video visit is poor, the visit may have to be switched to a telephone visit.  With either a video or telephone visit, we are not always able to ensure that we have a secure connection.     I need to obtain your verbal consent now.   Are you willing to proceed with your visit today? YES   Vincent [name] has provided verbal consent on 03/09/2024 for a virtual visit (video or telephone).   Mary-Margaret Gaylyn Keas, FNP   Date: 03/09/2024 11:14 AM   Virtual Visit via Video Note   I, Mary-Margaret Gaylyn Keas, connected with Vincent [name] ([id number], 1986-03-18) on 03/09/24 at 11:00 AM EDT by a video-enabled telemedicine application and verified that I am speaking with the correct person using two identifiers.  Location: Patient: Virtual Visit Location Patient: Home Provider: Virtual Visit Location Provider: Mobile   I discussed the  limitations of evaluation and management by telemedicine and the availability of in person appointments. The patient expressed understanding and agreed to proceed.    History of Present Illness: Vincent [name] is a 38 y.o. who identifies as a male who was assigned male at birth, and is being seen today for sinusitis.  HPI: Sinusitis This is a new problem. The current episode started 1 to 4 weeks ago. The problem has been waxing and waning since onset. The maximum temperature recorded prior to his arrival was 100.4 - 100.9 F. The fever has been present for 1 to 2 days. His pain is at a severity of 8/10. The pain is moderate. Associated symptoms include congestion, coughing, headaches, sinus pressure and a sore throat. Treatments tried: delsym. The treatment provided mild relief.    Review of Systems  Constitutional:  Positive for fever and malaise/fatigue.  HENT:  Positive for congestion, sinus pressure and sore throat.   Respiratory:  Positive for cough.   Neurological:  Positive for headaches.    Problems:  Patient Active Problem List   Diagnosis Date Noted   Diverticulosis large intestine w/o perforation or abscess w/o bleeding 08/21/2022   Anxiety with depression 07/30/2021   Hypogonadism in male 11/21/2020   Essential hypertension 09/23/2019   BMI 34.0-34.9,adult 09/23/2019   Insomnia 09/23/2019   Controlled substance agreement signed 09/23/2019   Attention deficit hyperactivity disorder (ADHD), predominantly inattentive type  08/26/2018   Mild intermittent asthma without complication 08/26/2018   Other idiopathic scoliosis, thoracolumbar region 05/29/2017    Allergies:  Allergies  Allergen Reactions   Tigan [Trimethobenzamide Hcl]    Medications:  Current Outpatient Medications:    albuterol  (VENTOLIN  HFA) 108 (90 Base) MCG/ACT inhaler, INHALE 1-2 PUFFS BY MOUTH EVERY 6 HOURS AS NEEDED FOR WHEEZE OR SHORTNESS OF BREATH, Disp: 6.7 each, Rfl: 2   atorvastatin  (LIPITOR) 40 MG  tablet, Take 1 tablet (40 mg total) by mouth daily., Disp: 90 tablet, Rfl: 1   fluticasone  (FLONASE ) 50 MCG/ACT nasal spray, PLACE 1 SPRAY INTO BOTH NOSTRILS 2 (TWO) TIMES DAILY AS NEEDED FOR ALLERGIES OR RHINITIS., Disp: 48 mL, Rfl: 1   lisinopril  (ZESTRIL ) 40 MG tablet, Take 1 tablet (40 mg total) by mouth daily., Disp: 90 tablet, Rfl: 1   meloxicam  (MOBIC ) 15 MG tablet, TAKE 1 TABLET (15 MG TOTAL) BY MOUTH DAILY., Disp: 90 tablet, Rfl: 2   predniSONE  (DELTASONE ) 20 MG tablet, 2 po at same time daily for 5 days, Disp: 10 tablet, Rfl: 0   testosterone  cypionate (DEPOTESTOSTERONE CYPIONATE) 200 MG/ML injection, INJECT 1.5 MLS (300 MG TOTAL) INTO THE MUSCLE EVERY 14 (FOURTEEN) DAYS., Disp: 3 mL, Rfl: 0   trazodone  (DESYREL ) 300 MG tablet, TAKE 1 TABLET BY MOUTH EVERYDAY AT BEDTIME, Disp: 30 tablet, Rfl: 2  Observations/Objective: Patient is well-developed, well-nourished in no acute distress.  Resting comfortably  at home.  Head is normocephalic, atraumatic.  No labored breathing.  Speech is clear and coherent with logical content.  Patient is alert and oriented at baseline.  Bil maxillary sinus pressure Deep dry cough Face flushed  Assessment and Plan:  Vincent [name] in today with chief complaint of No chief complaint on file.   1. Acute non-recurrent maxillary sinusitis (Primary) 1. Take meds as prescribed 2. Use a cool mist humidifier especially during the winter months and when heat has been humid. 3. Use saline nose sprays frequently 4. Saline irrigations of the nose can be very helpful if done frequently.  * 4X daily for 1 week*  * Use of a nettie pot can be helpful with this. Follow directions with this* 5. Drink plenty of fluids 6. Keep thermostat turn down low 7.For any cough or congestion- hycodan with sedationprecautions 8. For fever or aces or pains- take tylenol  or ibuprofen appropriate for age and weight.  * for fevers greater than 101 orally you may alternate  ibuprofen and tylenol  every  3 hours.    - predniSONE  (DELTASONE ) 20 MG tablet; Take 2 tablets (40 mg total) by mouth daily with breakfast for 5 days. 2 po daily for 5 days  Dispense: 10 tablet; Refill: 0 - amoxicillin -clavulanate (AUGMENTIN ) 875-125 MG tablet; Take 1 tablet by mouth 2 (two) times daily.  Dispense: 14 tablet; Refill: 0  2. Acute cough Force fluids Run humidifier - HYDROcodone bit-homatropine (HYCODAN) 5-1.5 MG/5ML syrup; Take 5 mLs by mouth every 6 (six) hours as needed for cough.  Dispense: 120 mL; Refill: 0    Follow Up Instructions: I discussed the assessment and treatment plan with the patient. The patient was provided an opportunity to ask questions and all were answered. The patient agreed with the plan and demonstrated an understanding of the instructions.  A copy of instructions were sent to the patient via MyChart.  The patient was advised to call back or seek an in-person evaluation if the symptoms worsen or if the condition fails to improve as anticipated.  Time:  I spent 7 minutes with the patient via telehealth technology discussing the above problems/concerns.    Mary-Margaret Gaylyn Keas, FNP

## 2024-03-09 NOTE — Patient Instructions (Signed)
1. Take meds as prescribed 2. Use a cool mist humidifier especially during the winter months and when heat has been humid. 3. Use saline nose sprays frequently 4. Saline irrigations of the nose can be very helpful if done frequently.  * 4X daily for 1 week*  * Use of a nettie pot can be helpful with this. Follow directions with this* 5. Drink plenty of fluids 6. Keep thermostat turn down low 7.For any cough or congestion- hycodan with sedation precautions 8. For fever or aces or pains- take tylenol or ibuprofen appropriate for age and weight.  * for fevers greater than 101 orally you may alternate ibuprofen and tylenol every  3 hours.    

## 2024-03-23 NOTE — Telephone Encounter (Signed)
 Probably viral- should not need antibiotic.

## 2024-03-26 ENCOUNTER — Ambulatory Visit: Payer: Self-pay

## 2024-03-26 ENCOUNTER — Other Ambulatory Visit: Payer: Self-pay | Admitting: Nurse Practitioner

## 2024-03-26 DIAGNOSIS — J4520 Mild intermittent asthma, uncomplicated: Secondary | ICD-10-CM

## 2024-03-26 DIAGNOSIS — R051 Acute cough: Secondary | ICD-10-CM

## 2024-03-26 DIAGNOSIS — U071 COVID-19: Secondary | ICD-10-CM

## 2024-03-26 MED ORDER — FLUTICASONE PROPIONATE 50 MCG/ACT NA SUSP: 1.0000 | Freq: Two times a day (BID) | NASAL | 1 refills | Status: AC | PRN

## 2024-03-26 MED ORDER — HYDROCODONE BIT-HOMATROP MBR 5-1.5 MG/5ML PO SOLN: 5.0000 mL | Freq: Four times a day (QID) | ORAL | 0 refills | Status: DC | PRN

## 2024-03-26 NOTE — Telephone Encounter (Signed)
 Called and spoke with patient. MMM sent in cough med and flonase . Appt made for a video visit for Monday if patient doesn't improve by then.

## 2024-03-26 NOTE — Telephone Encounter (Signed)
 Probably viral and another antibiotic will not help. Continue current tretasments. Is going to last 10 days no matter what

## 2024-03-26 NOTE — Telephone Encounter (Signed)
  FYI Only or Action Required?: Action required by provider: medication refill request and update on patient condition.  Patient was last seen in primary care on 03/09/2024 by Gladis Mustard, FNP. Called Nurse Triage reporting Nasal Congestion, Cough, and Otalgia. Symptoms began several weeks ago. Interventions attempted: OTC medications: mucinex , tylenol  cold and flu, delsym, Prescription medications: cough med. With codeine, and Rest, hydration, or home remedies. Symptoms are: unchanged.  Triage Disposition: See Physician Within 24 Hours will go to UC if needed  Patient/caregiver understands and will follow disposition?:    Summary: Symptoms have returned after finishing antibiotic   Symptoms have returned following antibiotic, has sore throat, congestion, cough, ear ache, seeking assistance.   Best contact: [phone]   Says he is working today, his next off day will not be until next Saturday. He is hoping to receive assistance         Reason for Disposition  [1] Continuous (nonstop) coughing interferes with work or school AND [2] no improvement using cough treatment per Care Advice  Answer Assessment - Initial Assessment Questions Patient requests antibiotic and/or codeine containing cough medicine  Please leave detailed message if you call patient, or communicate via mychart   Patient has tried all OTC medications to manage symptoms including delsym, mucinex , tylenol  cold and flu without improvement. Has tried nasal rinses, but nasal path blocked. Has use albuterol  MDI for cough, but reports no discernable difference Encouraged to take care in using medications with combination ingredients together, verbalizes understanding   1. ONSET: When did the cough begin?      About two weeks ago 2. SEVERITY: How bad is the cough today?      Severe, patient unable to complete sentence without coughing. Coughing inbetween speaking as well 3. SPUTUM: Describe the color of  your sputum (none, dry cough; clear, white, yellow, green)     Yellow, at night only  5. DIFFICULTY BREATHING: Are you having difficulty breathing? If Yes, ask: How bad is it? (e.g., mild, moderate, severe)    - MILD: No SOB at rest, mild SOB with walking, speaks normally in sentences, can lie down, no retractions, pulse < 100.    - MODERATE: SOB at rest, SOB with minimal exertion and prefers to sit, cannot lie down flat, speaks in phrases, mild retractions, audible wheezing, pulse 100-120.    - SEVERE: Very SOB at rest, speaks in single words, struggling to breathe, sitting hunched forward, retractions, pulse > 120      Denies 6. FEVER: Do you have a fever? If Yes, ask: What is your temperature, how was it measured, and when did it start?     denies 7. CARDIAC HISTORY: Do you have any history of heart disease? (e.g., heart attack, congestive heart failure)      hypertension 8. LUNG HISTORY: Do you have any history of lung disease?  (e.g., pulmonary embolus, asthma, emphysema)     History of asthma 10. OTHER SYMPTOMS: Do you have any other symptoms? (e.g., runny nose, wheezing, chest pain)       Runny nose at night, right ear pain, right eye and right sided face pain  Protocols used: Cough - Acute Productive-A-AH

## 2024-03-26 NOTE — Progress Notes (Signed)
 Meds ordered this encounter  Medications   HYDROcodone bit-homatropine (HYCODAN) 5-1.5 MG/5ML syrup    Sig: Take 5 mLs by mouth every 6 (six) hours as needed for cough.    Dispense:  120 mL    Refill:  0    Supervising Provider:   DETTINGER, JOSHUA A [[id number]]   fluticasone  (FLONASE ) 50 MCG/ACT nasal spray    Sig: Place 1 spray into both nostrils 2 (two) times daily as needed for allergies or rhinitis.    Dispense:  48 mL    Refill:  1    Supervising Provider:   MARYANNE CHEW A [[id number]]   Vincent Gladis, FNP

## 2024-03-30 ENCOUNTER — Encounter: Admitting: Nurse Practitioner

## 2024-03-30 ENCOUNTER — Telehealth: Payer: Self-pay

## 2024-03-30 NOTE — Progress Notes (Signed)
 Erroneous eincounter- patient said he no longer needed appoitmnet

## 2024-03-30 NOTE — Telephone Encounter (Signed)
 Copied from CRM [id number]. Topic: Clinical - Prescription Issue >> Mar 26, 2024  3:26 PM Powell HERO wrote: Reason for CRM: HYDROcodone  bit-homatropine (HYCODAN) 5-1.5 MG/5ML syrup and amoxicillin -clavulanate (AUGMENTIN ) 875-125 MG tablet and predniSONE  (DELTASONE ) 20 MG tablet . States these are supposed to be sent to the pharmacy after speaking with triage earlier and he is calling in to see what the status is, states he called earlier and someone told him he should get a call back in a couple hours and he hasn't heard back.

## 2024-03-30 NOTE — Telephone Encounter (Signed)
 Patient spoke with provider this morning and does not need any additional meds

## 2024-03-31 ENCOUNTER — Other Ambulatory Visit: Payer: Self-pay | Admitting: Nurse Practitioner

## 2024-03-31 DIAGNOSIS — I10 Essential (primary) hypertension: Secondary | ICD-10-CM

## 2024-03-31 DIAGNOSIS — E291 Testicular hypofunction: Secondary | ICD-10-CM

## 2024-04-07 ENCOUNTER — Other Ambulatory Visit: Payer: Self-pay | Admitting: Nurse Practitioner

## 2024-04-07 DIAGNOSIS — E291 Testicular hypofunction: Secondary | ICD-10-CM

## 2024-04-07 MED ORDER — TESTOSTERONE CYPIONATE 200 MG/ML IM SOLN: 300.0000 mg | INTRAMUSCULAR | 1 refills | Status: DC

## 2024-04-07 NOTE — Telephone Encounter (Unsigned)
 Copied from CRM [id number]. Topic: Clinical - Medication Refill >> Apr 07, 2024  2:59 PM Mia F wrote: Medication: testosterone  cypionate (DEPOTESTOSTERONE CYPIONATE) 200 MG/ML injection  Has the patient contacted their pharmacy? No (Agent: If no, request that the patient contact the pharmacy for the refill. If patient does not wish to contact the pharmacy document the reason why and proceed with request.) (Agent: If yes, when and what did the pharmacy advise?)  This is the patient's preferred pharmacy:  [id number] - [place], [place] - [address] [place] [address] Phone: [phone] Fax: [phone]  Is this the correct pharmacy for this prescription? Yes If no, delete pharmacy and type the correct one.   Has the prescription been filled recently? Yes  Is the patient out of the medication? No  PT STATES HE JUST GOT BACK ON TRACK WITH THE MEDICATION BUT WILL BE OUT BEFORE THE NEXT APPT.   Has the patient been seen for an appointment in the last year OR does the patient have an upcoming appointment? Yes  Can we respond through MyChart? Yes  Agent: Please be advised that Rx refills may take up to 3 business days. We ask that you follow-up with your pharmacy.

## 2024-04-10 ENCOUNTER — Ambulatory Visit: Payer: 59 | Admitting: Nurse Practitioner

## 2024-04-28 ENCOUNTER — Other Ambulatory Visit: Payer: Self-pay | Admitting: Nurse Practitioner

## 2024-04-28 DIAGNOSIS — E291 Testicular hypofunction: Secondary | ICD-10-CM

## 2024-04-28 MED ORDER — TESTOSTERONE CYPIONATE 200 MG/ML IM SOLN
300.0000 mg | INTRAMUSCULAR | 1 refills | Status: DC
Start: 2024-04-28 — End: 2024-05-26

## 2024-04-28 NOTE — Telephone Encounter (Unsigned)
 Copied from CRM [id number]. Topic: Clinical - Medication Refill >> Apr 28, 2024  8:11 AM Cynthia K wrote: Medication: testosterone  cypionate (DEPOTESTOSTERONE CYPIONATE) 200 MG/ML injection  Has the patient contacted their pharmacy? Yes (Agent: If no, request that the patient contact the pharmacy for the refill. If patient does not wish to contact the pharmacy document the reason why and proceed with request.) (Agent: If yes, when and what did the pharmacy advise?) Pharmacy needs order to refill  This is the patient's preferred pharmacy:  [id number] - [place], [place] - [address] [place] [address] Phone: [phone] Fax: [phone]  Is this the correct pharmacy for this prescription? Yes If no, delete pharmacy and type the correct one.   Has the prescription been filled recently? No  Is the patient out of the medication? Yes He needs to take this week   Has the patient been seen for an appointment in the last year OR does the patient have an upcoming appointment? Yes  Can we respond through MyChart? Yes  Agent: Please be advised that Rx refills may take up to 3 business days. We ask that you follow-up with your pharmacy.

## 2024-04-30 ENCOUNTER — Ambulatory Visit: Admitting: Nurse Practitioner

## 2024-05-26 ENCOUNTER — Ambulatory Visit (INDEPENDENT_AMBULATORY_CARE_PROVIDER_SITE_OTHER): Admitting: Nurse Practitioner

## 2024-05-26 ENCOUNTER — Encounter: Payer: Self-pay | Admitting: Nurse Practitioner

## 2024-05-26 VITALS — BP 127/84 | HR 84 | Temp 98.4°F | Ht 69.0 in | Wt 247.0 lb

## 2024-05-26 DIAGNOSIS — E291 Testicular hypofunction: Secondary | ICD-10-CM | POA: Diagnosis not present

## 2024-05-26 DIAGNOSIS — I10 Essential (primary) hypertension: Secondary | ICD-10-CM | POA: Diagnosis not present

## 2024-05-26 DIAGNOSIS — Z0000 Encounter for general adult medical examination without abnormal findings: Secondary | ICD-10-CM

## 2024-05-26 DIAGNOSIS — Z0001 Encounter for general adult medical examination with abnormal findings: Secondary | ICD-10-CM | POA: Diagnosis not present

## 2024-05-26 DIAGNOSIS — K219 Gastro-esophageal reflux disease without esophagitis: Secondary | ICD-10-CM | POA: Diagnosis not present

## 2024-05-26 MED ORDER — ATORVASTATIN CALCIUM 40 MG PO TABS: 40.0000 mg | ORAL_TABLET | Freq: Every day | ORAL | 1 refills | Status: AC

## 2024-05-26 MED ORDER — TESTOSTERONE CYPIONATE 200 MG/ML IM SOLN: 300.0000 mg | INTRAMUSCULAR | 1 refills | Status: DC

## 2024-05-26 MED ORDER — LISINOPRIL 40 MG PO TABS: 40.0000 mg | ORAL_TABLET | Freq: Every day | ORAL | 1 refills | Status: AC

## 2024-05-26 MED ORDER — PANTOPRAZOLE SODIUM 40 MG PO TBEC: 40.0000 mg | DELAYED_RELEASE_TABLET | Freq: Every day | ORAL | 3 refills | Status: DC

## 2024-05-26 NOTE — Patient Instructions (Signed)
 GERD in Adults: Diet Changes When you have gastroesophageal reflux disease (GERD), you may need to make changes to your diet. Choosing the right foods can help with your symptoms. Think about working with an expert in healthy eating called a dietitian. They can help you make healthy food choices. What are tips for following this plan? Reading food labels Look for foods that are low in saturated fat. Foods that may help with your symptoms include: Foods with less than 5% of daily value (DV) of fat. Foods with 0 grams of trans fat. Cooking [employer] in ways that don't use a lot of fat. These ways include: Baking. Steaming. Grilling. Broiling. To add flavor, try to use herbs that are low in spice and acidity. Avoid frying your food. Meal planning  Eat small meals often rather than eating 3 large meals each day. Eat your meals slowly in a place where you feel relaxed. If told by your health care provider, avoid: Foods that cause symptoms. Keep a food diary to keep track of foods that cause symptoms. Alcohol. Drinking a lot of liquid with meals. General instructions For 2-3 hours after you eat, avoid: Bending over. Exercise. Lying down. Chew sugar-free gum after meals. What foods should I eat? Eat a healthy diet. Try to include: Foods with high amounts of fiber. These include: Fruits and vegetables. Whole grains and beans. Low-fat dairy products. Lean meats, fish, and poultry. Egg whites. Foods that cause symptoms in someone else may not cause symptoms for you. Work with your provider to find foods that are safe for you. The items listed above may not be all the foods and drinks you can have. Talk with a dietitian to learn more. The items listed above may not be a complete list of foods and beverages you can eat and drink. Contact a dietitian for more information. What foods should I avoid? Limiting some of these foods may help with your symptoms. Each person is different.  Talk with a dietitian or your provider to help you find the exact foods to avoid. Some of the foods to avoid may include: Fruits Fruits with a lot of acid in them. These may include citrus fruits, such as oranges, grapefruit, pineapple, and lemons. Vegetables Deep-fried vegetables, such as [place] fries. Vegetables, sauces, or toppings made with added fat and vegetables with acid in them. These may include tomatoes and tomato products, chili peppers, onions, garlic, and horseradish. Grains Pastries or quick breads with added fat. Meats and other proteins High-fat meats, such as fatty beef or pork, hot dogs, ribs, ham, sausage, salami, and bacon. Fried meat or protein, such as fried fish and fried chicken. Egg yolks. Fats and oils Butter. Margarine. Shortening. Ghee. Drinks Coffee and other drinks with caffeine in them. Fizzy and sugary drinks, such as soda and energy drinks. Fruit juice made with acidic fruits, such as orange or grapefruit. Tomato juice. Sweets and desserts Chocolate and cocoa. Donuts. Seasonings and condiments Mint, such as peppermint and spearmint. Condiments, herbs, or seasonings that cause symptoms. These may include curry, hot sauce, or vinegar-based salad dressings. The items listed above may not be all the foods and drinks you should avoid. Talk with a dietitian to learn more. Questions to ask your health care provider Changes to your diet and everyday life are often the first steps taken to manage symptoms of GERD. If these changes don't help, talk with your provider about taking medicines. Where to find more information International [employer] for Gastrointestinal [employer]:  aboutgerd.org This information is not intended to replace advice given to you by your health care provider. Make sure you discuss any questions you have with your health care provider. Document Revised: 07/23/2023 Document Reviewed: 02/06/2023 Elsevier Patient Education  2024 [employer].

## 2024-05-26 NOTE — Progress Notes (Signed)
 Subjective:    Patient ID: Vincent [name], male    DOB: [birth year], 38 y.o.   MRN: [id number]   Chief Complaint: annual physical   HPI:  Vincent [name] is a 38 y.o. who identifies as a male who was assigned male at birth.   Social history: Lives with: wife and daughter Work history: [employer]   Comes in today for follow up of the following chronic medical issues:  1. Essential hypertension No c/o chest pain, sob or headache. Does not check blood pressure at home BP Readings from Last 3 Encounters:  10/14/23 113/67  10/03/23 (!) 147/73  11/09/22 116/77     2. Anxiety with depression Has history of anxiety and depression. He is much better and is on no medication.    05/26/2024   11:53 AM 10/14/2023    3:39 PM 10/03/2023    9:42 AM 11/09/2022    3:53 PM  GAD 7 : Generalized Anxiety Score  Nervous, Anxious, on Edge 0 0 0 0  Control/stop worrying 0 0 0 1  Worry too much - different things 0 0 0 1  Trouble relaxing 3 1 2 1   Restless 3 0 1 1  Easily annoyed or irritable 0 0 0 0  Afraid - awful might happen 0 0 0 0  Total GAD 7 Score 6 1 3 4   Anxiety Difficulty Somewhat difficult Somewhat difficult Somewhat difficult Not difficult at all       05/26/2024   11:52 AM 10/14/2023    3:39 PM 10/03/2023    9:41 AM  Depression screen PHQ 2/9  Decreased Interest 0 1 1  Down, Depressed, Hopeless 0 0 0  PHQ - 2 Score 0 1 1  Altered sleeping 0 1 1  Tired, decreased energy 2 2 3   Change in appetite 0 0 2  Feeling bad or failure about yourself  0 0 0  Trouble concentrating 0 0 2  Moving slowly or fidgety/restless 0 0 0  Suicidal thoughts 0 0 0  PHQ-9 Score 2 4 9   Difficult doing work/chores Somewhat difficult Somewhat difficult Somewhat difficult     3. Diverticulosis large intestine w/o perforation or abscess w/o bleeding Had flare up in November. No problems after completing treatment.  4. Primary insomnia Takes trazadone to sleep and sleep about 7-8 hours a  night.  5. Hypogonadism in male Is on testosterone  and is doing well. Lab Results  Component Value Date   TESTOSTERONE  1,094 (H) 10/15/2023      New complaints: Gastric reflux. Waking him up at night.  Allergies  Allergen Reactions   Tigan [Trimethobenzamide Hcl]    Outpatient Encounter Medications as of 05/26/2024  Medication Sig   albuterol  (VENTOLIN  HFA) 108 (90 Base) MCG/ACT inhaler INHALE 1-2 PUFFS BY MOUTH EVERY 6 HOURS AS NEEDED FOR WHEEZE OR SHORTNESS OF BREATH   atorvastatin  (LIPITOR) 40 MG tablet TAKE 1 TABLET BY MOUTH EVERY DAY   fluticasone  (FLONASE ) 50 MCG/ACT nasal spray Place 1 spray into both nostrils 2 (two) times daily as needed for allergies or rhinitis.   HYDROcodone  bit-homatropine (HYCODAN) 5-1.5 MG/5ML syrup Take 5 mLs by mouth every 6 (six) hours as needed for cough.   lisinopril  (ZESTRIL ) 40 MG tablet TAKE 1 TABLET BY MOUTH EVERY DAY   meloxicam  (MOBIC ) 15 MG tablet TAKE 1 TABLET (15 MG TOTAL) BY MOUTH DAILY.   testosterone  cypionate (DEPOTESTOSTERONE CYPIONATE) 200 MG/ML injection Inject 1.5 mLs (300 mg total) into the muscle every 14 (fourteen)  days.   trazodone  (DESYREL ) 300 MG tablet TAKE 1 TABLET BY MOUTH EVERYDAY AT BEDTIME   No facility-administered encounter medications on file as of 05/26/2024.    Past Surgical History:  Procedure Laterality Date   MANDIBLE SURGERY  2009   VASECTOMY  2007    Family History  Family history unknown: Yes      Controlled substance contract: n/a     Review of Systems  Constitutional:  Negative for diaphoresis.  Eyes:  Negative for pain.  Respiratory:  Negative for shortness of breath.   Cardiovascular:  Negative for chest pain, palpitations and leg swelling.  Gastrointestinal:  Negative for abdominal pain.  Endocrine: Negative for polydipsia.  Skin:  Negative for rash.  Neurological:  Negative for dizziness, weakness and headaches.  Hematological:  Does not bruise/bleed easily.  All other systems  reviewed and are negative.      Objective:   Physical Exam Vitals and nursing note reviewed.  Constitutional:      Appearance: Normal appearance. He is well-developed.  HENT:     Head: Normocephalic.     Nose: Nose normal.     Mouth/Throat:     Mouth: Mucous membranes are moist.     Pharynx: Oropharynx is clear.  Eyes:     Pupils: Pupils are equal, round, and reactive to light.  Neck:     Thyroid : No thyroid  mass or thyromegaly.     Vascular: No carotid bruit or JVD.     Trachea: Phonation normal.  Cardiovascular:     Rate and Rhythm: Normal rate and regular rhythm.  Pulmonary:     Effort: Pulmonary effort is normal. No respiratory distress.     Breath sounds: Normal breath sounds.  Abdominal:     General: Bowel sounds are normal.     Palpations: Abdomen is soft.     Tenderness: There is no abdominal tenderness.  Musculoskeletal:        General: Normal range of motion.     Cervical back: Normal range of motion and neck supple.  Lymphadenopathy:     Cervical: No cervical adenopathy.  Skin:    General: Skin is warm and dry.  Neurological:     Mental Status: He is alert and oriented to person, place, and time.  Psychiatric:        Behavior: Behavior normal.        Thought Content: Thought content normal.        Judgment: Judgment normal.    BP 127/84   Pulse 84   Temp 98.4 F (36.9 C) (Temporal)   Ht 5' 9 (1.753 m)   Wt 247 lb (112 kg)   SpO2 95%   BMI 36.48 kg/m          Assessment & Plan:  Vincent [name] comes in today with chief complaint of annual physical  Diagnosis and orders addressed:  1. Essential hypertension Low sodium diet - CBC with Differential/Platelet - CMP14+EGFR - Lipid panel - lisinopril  (ZESTRIL ) 10 MG tablet; Take 1 tablet (10 mg total) by mouth daily.  Dispense: 90 tablet; Refill: 1  2. Anxiety with depression Stress management  3. Diverticulosis large intestine w/o perforation or abscess w/o bleeding Watch diet to  prevent flare up  4. Primary insomnia Bedtime routine - trazodone  (DESYREL ) 300 MG tablet; TAKE 1 TABLET BY MOUTH EVERYDAY AT BEDTIME  Dispense: 90 tablet; Refill: 1  5. Hypogonadism in male - testosterone  cypionate (DEPOTESTOSTERONE CYPIONATE) 200 MG/ML injection; Inject 1.5 mLs (300 mg  total) into the muscle every 14 (fourteen) days.  Dispense: 3 mL; Refill: 0  6. GERD Added protonix  daily Avoid spicy and fatty foods  Labs pending Health Maintenance reviewed Diet and exercise encouraged  Follow up plan: 6 months   Mary-Margaret Gladis, FNP

## 2024-05-29 ENCOUNTER — Other Ambulatory Visit

## 2024-06-05 ENCOUNTER — Other Ambulatory Visit

## 2024-06-05 DIAGNOSIS — I10 Essential (primary) hypertension: Secondary | ICD-10-CM

## 2024-06-05 DIAGNOSIS — Z0000 Encounter for general adult medical examination without abnormal findings: Secondary | ICD-10-CM

## 2024-06-05 LAB — LIPID PANEL

## 2024-06-06 LAB — CBC WITH DIFFERENTIAL/PLATELET
Basophils Absolute: 0.1 x10E3/uL (ref 0.0–0.2)
Basos: 1 %
EOS (ABSOLUTE): 0.2 x10E3/uL (ref 0.0–0.4)
Eos: 4 %
Hematocrit: 48.9 % (ref 37.5–51.0)
Hemoglobin: 16.5 g/dL (ref 13.0–17.7)
Immature Grans (Abs): 0 x10E3/uL (ref 0.0–0.1)
Immature Granulocytes: 0 %
Lymphocytes Absolute: 2.3 x10E3/uL (ref 0.7–3.1)
Lymphs: 39 %
MCH: 31.5 pg (ref 26.6–33.0)
MCHC: 33.7 g/dL (ref 31.5–35.7)
MCV: 93 fL (ref 79–97)
Monocytes Absolute: 0.5 x10E3/uL (ref 0.1–0.9)
Monocytes: 9 %
Neutrophils Absolute: 2.8 x10E3/uL (ref 1.4–7.0)
Neutrophils: 47 %
Platelets: 233 x10E3/uL (ref 150–450)
RBC: 5.24 x10E6/uL (ref 4.14–5.80)
RDW: 13.2 % (ref 11.6–15.4)
WBC: 5.9 x10E3/uL (ref 3.4–10.8)

## 2024-06-06 LAB — CMP14+EGFR
ALT: 51 IU/L — AB (ref 0–44)
AST: 52 IU/L — AB (ref 0–40)
Albumin: 4.3 g/dL (ref 4.1–5.1)
Alkaline Phosphatase: 57 IU/L (ref 44–121)
BUN/Creatinine Ratio: 11 (ref 9–20)
BUN: 14 mg/dL (ref 6–20)
Bilirubin Total: 0.4 mg/dL (ref 0.0–1.2)
CO2: 23 mmol/L (ref 20–29)
Calcium: 9.1 mg/dL (ref 8.7–10.2)
Chloride: 106 mmol/L (ref 96–106)
Creatinine, Ser: 1.28 mg/dL — AB (ref 0.76–1.27)
Globulin, Total: 2.2 g/dL (ref 1.5–4.5)
Glucose: 103 mg/dL — AB (ref 70–99)
Potassium: 5 mmol/L (ref 3.5–5.2)
Sodium: 142 mmol/L (ref 134–144)
Total Protein: 6.5 g/dL (ref 6.0–8.5)
eGFR: 73 mL/min/1.73 (ref >59)

## 2024-06-06 LAB — LIPID PANEL
Cholesterol, Total: 140 mg/dL (ref 100–199)
HDL: 39 mg/dL — AB (ref >39)
LDL CALC COMMENT:: 3.6 ratio (ref 0.0–5.0)
LDL Chol Calc (NIH): 69 mg/dL (ref 0–99)
Triglycerides: 192 mg/dL — AB (ref 0–149)
VLDL Cholesterol Cal: 32 mg/dL (ref 5–40)

## 2024-06-08 ENCOUNTER — Ambulatory Visit: Payer: Self-pay | Admitting: Nurse Practitioner

## 2024-06-21 ENCOUNTER — Other Ambulatory Visit: Payer: Self-pay | Admitting: Nurse Practitioner

## 2024-06-21 DIAGNOSIS — K219 Gastro-esophageal reflux disease without esophagitis: Secondary | ICD-10-CM

## 2024-08-22 ENCOUNTER — Other Ambulatory Visit: Payer: Self-pay | Admitting: Nurse Practitioner

## 2024-08-22 DIAGNOSIS — F5101 Primary insomnia: Secondary | ICD-10-CM

## 2024-08-22 DIAGNOSIS — E291 Testicular hypofunction: Secondary | ICD-10-CM

## 2024-09-18 ENCOUNTER — Other Ambulatory Visit: Payer: Self-pay | Admitting: Nurse Practitioner

## 2024-09-18 DIAGNOSIS — F5101 Primary insomnia: Secondary | ICD-10-CM

## 2024-09-20 ENCOUNTER — Other Ambulatory Visit: Payer: Self-pay | Admitting: Nurse Practitioner

## 2024-09-20 DIAGNOSIS — E291 Testicular hypofunction: Secondary | ICD-10-CM

## 2024-10-22 ENCOUNTER — Other Ambulatory Visit: Payer: Self-pay | Admitting: Nurse Practitioner

## 2024-10-22 DIAGNOSIS — E291 Testicular hypofunction: Secondary | ICD-10-CM

## 2024-10-26 ENCOUNTER — Other Ambulatory Visit: Payer: Self-pay | Admitting: Nurse Practitioner

## 2024-10-26 DIAGNOSIS — E291 Testicular hypofunction: Secondary | ICD-10-CM

## 2024-10-26 MED ORDER — TESTOSTERONE CYPIONATE 200 MG/ML IM SOLN: 300.0000 mg | INTRAMUSCULAR | 2 refills | Status: AC

## 2024-11-20 ENCOUNTER — Ambulatory Visit: Payer: Self-pay | Admitting: Nurse Practitioner
